# Patient Record
Sex: Female | Born: 1955 | Race: White | Hispanic: No | Marital: Married | Smoking: Former smoker
Health system: Southern US, Community
[De-identification: ages and names within clinical notes are randomized; demographics above are authoritative.]

## PROBLEM LIST (undated history)

## (undated) DIAGNOSIS — E119 Type 2 diabetes mellitus without complications: Secondary | ICD-10-CM

## (undated) DIAGNOSIS — I10 Essential (primary) hypertension: Secondary | ICD-10-CM

## (undated) DIAGNOSIS — E079 Disorder of thyroid, unspecified: Secondary | ICD-10-CM

## (undated) HISTORY — PX: WISDOM TOOTH EXTRACTION: SHX21

## (undated) HISTORY — PX: APPENDECTOMY: SHX54

## (undated) HISTORY — PX: ANKLE SURGERY: SHX546

---

## 2000-10-20 ENCOUNTER — Ambulatory Visit (HOSPITAL_BASED_OUTPATIENT_CLINIC_OR_DEPARTMENT_OTHER): Admission: RE | Admit: 2000-10-20 | Discharge: 2000-10-20 | Payer: Self-pay | Admitting: Internal Medicine

## 2000-11-04 ENCOUNTER — Ambulatory Visit (HOSPITAL_COMMUNITY): Admission: RE | Admit: 2000-11-04 | Discharge: 2000-11-04 | Payer: Self-pay | Admitting: Gastroenterology

## 2001-02-23 ENCOUNTER — Other Ambulatory Visit: Admission: RE | Admit: 2001-02-23 | Discharge: 2001-02-23 | Payer: Self-pay | Admitting: *Deleted

## 2002-06-23 ENCOUNTER — Encounter: Admission: RE | Admit: 2002-06-23 | Discharge: 2002-06-23 | Payer: Self-pay | Admitting: Internal Medicine

## 2002-06-23 ENCOUNTER — Encounter: Payer: Self-pay | Admitting: Internal Medicine

## 2002-10-03 ENCOUNTER — Encounter: Admission: RE | Admit: 2002-10-03 | Discharge: 2002-10-03 | Payer: Self-pay | Admitting: General Surgery

## 2002-10-03 ENCOUNTER — Encounter: Payer: Self-pay | Admitting: General Surgery

## 2003-01-26 ENCOUNTER — Encounter: Payer: Self-pay | Admitting: General Surgery

## 2003-01-26 ENCOUNTER — Encounter: Admission: RE | Admit: 2003-01-26 | Discharge: 2003-01-26 | Payer: Self-pay | Admitting: General Surgery

## 2003-01-27 ENCOUNTER — Encounter (INDEPENDENT_AMBULATORY_CARE_PROVIDER_SITE_OTHER): Payer: Self-pay | Admitting: Specialist

## 2003-01-27 ENCOUNTER — Ambulatory Visit (HOSPITAL_BASED_OUTPATIENT_CLINIC_OR_DEPARTMENT_OTHER): Admission: RE | Admit: 2003-01-27 | Discharge: 2003-01-27 | Payer: Self-pay | Admitting: General Surgery

## 2004-11-10 ENCOUNTER — Encounter: Admission: RE | Admit: 2004-11-10 | Discharge: 2004-11-10 | Payer: Self-pay | Admitting: Internal Medicine

## 2004-12-04 ENCOUNTER — Encounter: Admission: RE | Admit: 2004-12-04 | Discharge: 2004-12-04 | Payer: Self-pay | Admitting: Obstetrics and Gynecology

## 2007-09-22 ENCOUNTER — Encounter: Admission: RE | Admit: 2007-09-22 | Discharge: 2007-09-22 | Payer: Self-pay | Admitting: Obstetrics

## 2008-06-15 ENCOUNTER — Encounter: Admission: RE | Admit: 2008-06-15 | Discharge: 2008-06-15 | Payer: Self-pay | Admitting: Internal Medicine

## 2011-01-17 NOTE — Op Note (Signed)
   NAME:  Courtney Fernandez, Courtney Fernandez                        ACCOUNT NO.:  [id number]   MEDICAL RECORD NO.:  [id number]                   PATIENT TYPE:  AMB   LOCATION:  DSC                                  FACILITY:  [facility]   PHYSICIAN:  Jimmye Norman III, M.D.               DATE OF BIRTH:  09-Jul-1956   DATE OF PROCEDURE:  01/27/2003  DATE OF DISCHARGE:                                 OPERATIVE REPORT   PREOPERATIVE DIAGNOSIS:  Left cervical submandibular mass.   POSTOPERATIVE DIAGNOSIS:  Left submandibular lymph node with mild  lymphadenopathy.   PROCEDURE:  Left submandibular lymph node mass removal or excision.   SURGEON:  Jimmye Norman, M.D.   ASSISTANT:  None.   ANESTHESIA:  General endotracheal.   ESTIMATED BLOOD LOSS:  Less than 20 mL.   COMPLICATIONS:  None.   CONDITION:  Stable.   INDICATIONS FOR OPERATION:  The patient is a 55 year old woman with a  persistent mass at the left submandibular anterior cervical area who comes  in now for a biopsy.   OPERATION:  The patient was taken to the operating room and placed on the  table in the supine position.  After an adequate endotracheal anesthetic was  administered, she was prepped and draped in the usual sterile manner,  exposing the left neck, which was turned to the right.   Just over the palpable mass in the submandibular area, a transverse  curvilinear incision was made using a #15 blade, approximately 1-1/2 to 2 cm  long.  We subsequently dissected down to the subcutaneous tissue, and using  mainly palpation, we were able to dissect out the submandibular lymph node,  which measured about a cm in size.  There were minimal neurovascular  structures which were encountered during the procedure, and it appeared to  be just beneath the platysmas layer.   Once the lymph node was removed, hemostasis was obtained using  electrocautery.  Then, the wound was closed with two layers, the subcu layer  with interrupted 4-0 Vicryl and then  the skin closed using running  subcuticular stitch of 5-0 Vicryl.  A sterile dressing was applied,  including Steri-Strips.  All needle counts, sponge counts, and instrument  counts were correct.                                               Kathrin Ruddy, M.D.   JW/MEDQ  D:  01/27/2003  T:  01/27/2003  Job:  [id number]

## 2011-01-17 NOTE — Procedures (Signed)
[facility]  Patient:    Courtney Fernandez, Courtney Fernandez                       MRN: [id number] Proc. Date: 11/04/00 Attending:  Verlin Grills, M.D.                           Procedure Report  DATE OF BIRTH:  09/01/1956  REFERRING PHYSICIAN:  PROCEDURE PERFORMED:  Colonoscopy.  ENDOSCOPIST:  Verlin Grills, M.D.  INDICATIONS FOR PROCEDURE:  The patient is a 55 year old female.  She has symptoms compatible with irritable bowel syndrome (diarrhea alternating with constipation and right-sided abdominal pain).  She intermittently passes fresh blood with her constipated bowel movements.  Her brother has had colon  polyps removed.  She denies a family history of colon cancer.  I discussed with the patient the complications associated with colonoscopy and polypectomy including a 1 per 1000 risk of bleeding and 4 per 1000 risk of colonic rupture requiring emergency surgery.  The patient has signed the operative permit.  PREMEDICATION:  Fentanyl 50 mcg, Versed 10 mg.  ENDOSCOPE:  Olympus pediatric colonoscope.  DESCRIPTION OF PROCEDURE:  After obtaining informed consent, the patient was placed in the left lateral decubitus position.  I administered intravenous fentanyl and intravenous Versed to achieve sedation for the procedure.  The patients blood pressure, oxygen saturation and cardiac rhythm were monitored throughout the procedure and documented in the medical record.  Anal inspection was normal.  Digital rectal examination was normal.  The Olympus pediatric video colonoscope was then introduced into the rectum and under direct vision, easily advanced to the cecum.  Colonic preparation for the exam today was excellent.  Rectum:  Normal.  Sigmoid colon and descending colon:  Normal.  Splenic flexure:  Normal.  Transverse colon:  Normal.  Hepatic flexure:  Normal.  Ascending colon:  Normal.  Cecum and ileocecal valve:   Normal.  ASSESSMENT:  Normal proctocolonoscopy to the cecum.  No endoscopic evidence for the presence of colorectal neoplasia or inflammatory bowel disease.  RECOMMENDATIONS:  Repeat colonoscopy in approximately 10 years. DD:  11/04/00 TD:  11/04/00 Job: [id number]

## 2012-03-23 ENCOUNTER — Other Ambulatory Visit (HOSPITAL_COMMUNITY)
Admission: RE | Admit: 2012-03-23 | Discharge: 2012-03-23 | Disposition: A | Discharge status: Home / Self Care | Payer: Self-pay | Source: Ambulatory Visit | Attending: Obstetrics and Gynecology | Admitting: Obstetrics and Gynecology

## 2012-03-23 ENCOUNTER — Other Ambulatory Visit: Payer: Self-pay | Admitting: Nurse Practitioner

## 2012-03-23 DIAGNOSIS — Z1151 Encounter for screening for human papillomavirus (HPV): Secondary | ICD-10-CM | POA: Insufficient documentation

## 2012-03-23 DIAGNOSIS — Z01419 Encounter for gynecological examination (general) (routine) without abnormal findings: Secondary | ICD-10-CM | POA: Insufficient documentation

## 2014-09-13 ENCOUNTER — Other Ambulatory Visit: Payer: Self-pay | Admitting: Dermatology

## 2014-09-13 ENCOUNTER — Other Ambulatory Visit: Payer: Self-pay | Admitting: Family Medicine

## 2014-09-13 DIAGNOSIS — R109 Unspecified abdominal pain: Secondary | ICD-10-CM

## 2014-09-19 ENCOUNTER — Ambulatory Visit
Admission: RE | Admit: 2014-09-19 | Discharge: 2014-09-19 | Disposition: A | Discharge status: Home / Self Care | Payer: 59 | Source: Ambulatory Visit | Attending: Family Medicine | Admitting: Family Medicine

## 2014-09-19 DIAGNOSIS — R109 Unspecified abdominal pain: Secondary | ICD-10-CM

## 2014-09-26 ENCOUNTER — Other Ambulatory Visit: Payer: Self-pay | Admitting: Nurse Practitioner

## 2014-09-26 ENCOUNTER — Other Ambulatory Visit (HOSPITAL_COMMUNITY)
Admission: RE | Admit: 2014-09-26 | Discharge: 2014-09-26 | Disposition: A | Discharge status: Home / Self Care | Payer: 59 | Source: Ambulatory Visit | Attending: Nurse Practitioner | Admitting: Nurse Practitioner

## 2014-09-26 DIAGNOSIS — Z1151 Encounter for screening for human papillomavirus (HPV): Secondary | ICD-10-CM | POA: Insufficient documentation

## 2014-09-26 DIAGNOSIS — N949 Unspecified condition associated with female genital organs and menstrual cycle: Secondary | ICD-10-CM

## 2014-09-26 DIAGNOSIS — R102 Pelvic and perineal pain: Secondary | ICD-10-CM

## 2014-09-26 DIAGNOSIS — Z01419 Encounter for gynecological examination (general) (routine) without abnormal findings: Secondary | ICD-10-CM | POA: Insufficient documentation

## 2014-09-27 LAB — CYTOLOGY - PAP

## 2014-10-04 ENCOUNTER — Ambulatory Visit
Admission: RE | Admit: 2014-10-04 | Discharge: 2014-10-04 | Disposition: A | Discharge status: Home / Self Care | Payer: 59 | Source: Ambulatory Visit | Attending: Nurse Practitioner | Admitting: Nurse Practitioner

## 2014-10-04 DIAGNOSIS — R102 Pelvic and perineal pain: Secondary | ICD-10-CM

## 2014-10-04 DIAGNOSIS — N949 Unspecified condition associated with female genital organs and menstrual cycle: Secondary | ICD-10-CM

## 2014-10-04 MED ORDER — GADOBENATE DIMEGLUMINE 529 MG/ML IV SOLN
20.0000 mL | Freq: Once | INTRAVENOUS | Status: AC | PRN
  Administered 2014-10-04: 20 mL via INTRAVENOUS

## 2015-04-06 ENCOUNTER — Other Ambulatory Visit: Payer: Self-pay | Admitting: Obstetrics & Gynecology

## 2015-07-30 ENCOUNTER — Encounter (HOSPITAL_COMMUNITY): Payer: Self-pay | Admitting: Emergency Medicine

## 2015-07-30 DIAGNOSIS — E039 Hypothyroidism, unspecified: Secondary | ICD-10-CM | POA: Diagnosis present

## 2015-07-30 DIAGNOSIS — F329 Major depressive disorder, single episode, unspecified: Secondary | ICD-10-CM | POA: Diagnosis present

## 2015-07-30 DIAGNOSIS — E1100 Type 2 diabetes mellitus with hyperosmolarity without nonketotic hyperglycemic-hyperosmolar coma (NKHHC): Principal | ICD-10-CM | POA: Diagnosis present

## 2015-07-30 DIAGNOSIS — Z833 Family history of diabetes mellitus: Secondary | ICD-10-CM

## 2015-07-30 DIAGNOSIS — Z79899 Other long term (current) drug therapy: Secondary | ICD-10-CM

## 2015-07-30 DIAGNOSIS — I10 Essential (primary) hypertension: Secondary | ICD-10-CM | POA: Diagnosis present

## 2015-07-30 DIAGNOSIS — R5383 Other fatigue: Secondary | ICD-10-CM | POA: Diagnosis not present

## 2015-07-30 DIAGNOSIS — E1165 Type 2 diabetes mellitus with hyperglycemia: Secondary | ICD-10-CM | POA: Diagnosis present

## 2015-07-30 LAB — CBG MONITORING, ED: Glucose-Capillary: >600 mg/dL (ref 65–99)

## 2015-07-30 NOTE — ED Notes (Signed)
Pt. reports fatigue , blurred vision , nausea and polydipsia onset this week , denies fever / respirations unlabored.

## 2015-07-31 ENCOUNTER — Inpatient Hospital Stay (HOSPITAL_COMMUNITY)
Admission: EM | Admit: 2015-07-31 | Discharge: 2015-08-01 | DRG: 639 | Disposition: A | Discharge status: Home / Self Care | Payer: 59 | Attending: Internal Medicine | Admitting: Internal Medicine

## 2015-07-31 ENCOUNTER — Encounter (HOSPITAL_COMMUNITY): Payer: Self-pay | Admitting: General Practice

## 2015-07-31 DIAGNOSIS — R5383 Other fatigue: Secondary | ICD-10-CM | POA: Diagnosis present

## 2015-07-31 DIAGNOSIS — E1100 Type 2 diabetes mellitus with hyperosmolarity without nonketotic hyperglycemic-hyperosmolar coma (NKHHC): Secondary | ICD-10-CM

## 2015-07-31 DIAGNOSIS — Z79899 Other long term (current) drug therapy: Secondary | ICD-10-CM | POA: Diagnosis not present

## 2015-07-31 DIAGNOSIS — R739 Hyperglycemia, unspecified: Secondary | ICD-10-CM | POA: Diagnosis not present

## 2015-07-31 DIAGNOSIS — E1165 Type 2 diabetes mellitus with hyperglycemia: Secondary | ICD-10-CM | POA: Diagnosis present

## 2015-07-31 DIAGNOSIS — I10 Essential (primary) hypertension: Secondary | ICD-10-CM | POA: Diagnosis present

## 2015-07-31 DIAGNOSIS — E039 Hypothyroidism, unspecified: Secondary | ICD-10-CM | POA: Diagnosis present

## 2015-07-31 DIAGNOSIS — Z833 Family history of diabetes mellitus: Secondary | ICD-10-CM | POA: Diagnosis not present

## 2015-07-31 DIAGNOSIS — F329 Major depressive disorder, single episode, unspecified: Secondary | ICD-10-CM | POA: Diagnosis present

## 2015-07-31 HISTORY — DX: Type 2 diabetes mellitus without complications: E11.9

## 2015-07-31 HISTORY — DX: Disorder of thyroid, unspecified: E07.9

## 2015-07-31 HISTORY — DX: Essential (primary) hypertension: I10

## 2015-07-31 LAB — GLUCOSE, CAPILLARY
GLUCOSE-CAPILLARY: 126 mg/dL — AB (ref 65–99)
GLUCOSE-CAPILLARY: 160 mg/dL — AB (ref 65–99)
GLUCOSE-CAPILLARY: 156 mg/dL — AB (ref 65–99)
GLUCOSE-CAPILLARY: 286 mg/dL — AB (ref 65–99)
GLUCOSE-CAPILLARY: 301 mg/dL — AB (ref 65–99)
Glucose-Capillary: 152 mg/dL — ABNORMAL HIGH (ref 65–99)
Glucose-Capillary: 244 mg/dL — ABNORMAL HIGH (ref 65–99)

## 2015-07-31 LAB — BASIC METABOLIC PANEL
ANION GAP: 8 (ref 5–15)
ANION GAP: 8 (ref 5–15)
Anion gap: 6 (ref 5–15)
Anion gap: 4 — ABNORMAL LOW (ref 5–15)
Anion gap: 6 (ref 5–15)
BUN: 18 mg/dL (ref 6–20)
BUN: 15 mg/dL (ref 6–20)
BUN: 10 mg/dL (ref 6–20)
BUN: 9 mg/dL (ref 6–20)
BUN: 7 mg/dL (ref 6–20)
CALCIUM: 9.2 mg/dL (ref 8.9–10.3)
CALCIUM: 8.9 mg/dL (ref 8.9–10.3)
CALCIUM: 8.8 mg/dL — AB (ref 8.9–10.3)
CALCIUM: 8.7 mg/dL — AB (ref 8.9–10.3)
CALCIUM: 8.8 mg/dL — AB (ref 8.9–10.3)
CO2: 29 mmol/L (ref 22–32)
CO2: 26 mmol/L (ref 22–32)
CO2: 27 mmol/L (ref 22–32)
CO2: 27 mmol/L (ref 22–32)
CO2: 27 mmol/L (ref 22–32)
Chloride: 92 mmol/L — ABNORMAL LOW (ref 101–111)
Chloride: 100 mmol/L — ABNORMAL LOW (ref 101–111)
Chloride: 105 mmol/L (ref 101–111)
Chloride: 104 mmol/L (ref 101–111)
Chloride: 104 mmol/L (ref 101–111)
Creatinine, Ser: 0.94 mg/dL (ref 0.44–1.00)
Creatinine, Ser: 0.8 mg/dL (ref 0.44–1.00)
Creatinine, Ser: 0.59 mg/dL (ref 0.44–1.00)
Creatinine, Ser: 0.67 mg/dL (ref 0.44–1.00)
Creatinine, Ser: 0.67 mg/dL (ref 0.44–1.00)
GFR calc Af Amer: >60 mL/min (ref >60)
GFR calc Af Amer: >60 mL/min (ref >60)
GFR calc Af Amer: >60 mL/min (ref >60)
GFR calc Af Amer: >60 mL/min (ref >60)
GFR calc Af Amer: >60 mL/min (ref >60)
GLUCOSE: 622 mg/dL — AB (ref 65–99)
GLUCOSE: 390 mg/dL — AB (ref 65–99)
GLUCOSE: 172 mg/dL — AB (ref 65–99)
GLUCOSE: 302 mg/dL — AB (ref 65–99)
GLUCOSE: 325 mg/dL — AB (ref 65–99)
POTASSIUM: 3.8 mmol/L (ref 3.5–5.1)
Potassium: 4.6 mmol/L (ref 3.5–5.1)
Potassium: 3.6 mmol/L (ref 3.5–5.1)
Potassium: 4.1 mmol/L (ref 3.5–5.1)
Potassium: 4.3 mmol/L (ref 3.5–5.1)
Sodium: 129 mmol/L — ABNORMAL LOW (ref 135–145)
Sodium: 134 mmol/L — ABNORMAL LOW (ref 135–145)
Sodium: 138 mmol/L (ref 135–145)
Sodium: 135 mmol/L (ref 135–145)
Sodium: 137 mmol/L (ref 135–145)

## 2015-07-31 LAB — CBG MONITORING, ED
GLUCOSE-CAPILLARY: 220 mg/dL — AB (ref 65–99)
GLUCOSE-CAPILLARY: 255 mg/dL — AB (ref 65–99)
Glucose-Capillary: >600 mg/dL (ref 65–99)
Glucose-Capillary: 425 mg/dL — ABNORMAL HIGH (ref 65–99)
Glucose-Capillary: 364 mg/dL — ABNORMAL HIGH (ref 65–99)
Glucose-Capillary: 149 mg/dL — ABNORMAL HIGH (ref 65–99)

## 2015-07-31 LAB — COMPREHENSIVE METABOLIC PANEL
ALT: 24 U/L (ref 14–54)
AST: 17 U/L (ref 15–41)
Albumin: 4.1 g/dL (ref 3.5–5.0)
Alkaline Phosphatase: 109 U/L (ref 38–126)
Anion gap: 10 (ref 5–15)
BUN: 22 mg/dL — ABNORMAL HIGH (ref 6–20)
CHLORIDE: 80 mmol/L — AB (ref 101–111)
CO2: 28 mmol/L (ref 22–32)
CREATININE: 1.16 mg/dL — AB (ref 0.44–1.00)
Calcium: 10 mg/dL (ref 8.9–10.3)
GFR calc non Af Amer: 51 mL/min — ABNORMAL LOW (ref >60)
GFR, EST AFRICAN AMERICAN: 59 mL/min — AB (ref >60)
Glucose, Bld: 1200 mg/dL (ref 65–99)
Potassium: 4.8 mmol/L (ref 3.5–5.1)
SODIUM: 118 mmol/L — AB (ref 135–145)
Total Bilirubin: 1 mg/dL (ref 0.3–1.2)
Total Protein: 7.6 g/dL (ref 6.5–8.1)

## 2015-07-31 LAB — URINALYSIS, ROUTINE W REFLEX MICROSCOPIC
Bilirubin Urine: NEGATIVE
Glucose, UA: >1000 mg/dL — AB
Hgb urine dipstick: NEGATIVE
Ketones, ur: NEGATIVE mg/dL
LEUKOCYTES UA: NEGATIVE
NITRITE: NEGATIVE
PH: 6.5 (ref 5.0–8.0)
Protein, ur: NEGATIVE mg/dL
SPECIFIC GRAVITY, URINE: 1.031 — AB (ref 1.005–1.030)

## 2015-07-31 LAB — CBC
HCT: 34 % — ABNORMAL LOW (ref 36.0–46.0)
Hemoglobin: 12.1 g/dL (ref 12.0–15.0)
MCH: 29.8 pg (ref 26.0–34.0)
MCHC: 35.6 g/dL (ref 30.0–36.0)
MCV: 83.7 fL (ref 78.0–100.0)
PLATELETS: 166 10*3/uL (ref 150–400)
RBC: 4.06 MIL/uL (ref 3.87–5.11)
RDW: 11.9 % (ref 11.5–15.5)
WBC: 6.6 10*3/uL (ref 4.0–10.5)

## 2015-07-31 LAB — MRSA PCR SCREENING: MRSA by PCR: NEGATIVE

## 2015-07-31 LAB — URINE MICROSCOPIC-ADD ON: BACTERIA UA: NONE SEEN

## 2015-07-31 LAB — TSH: TSH: 5.75 u[IU]/mL — ABNORMAL HIGH (ref 0.350–4.500)

## 2015-07-31 MED ORDER — SODIUM CHLORIDE 0.9 % IV SOLN
INTRAVENOUS | Status: DC
Start: 2015-07-31 — End: 2015-07-31
  Administered 2015-07-31: 03:00:00 via INTRAVENOUS

## 2015-07-31 MED ORDER — INSULIN ASPART 100 UNIT/ML ~~LOC~~ SOLN
0.0000 [IU] | SUBCUTANEOUS | Status: DC
  Administered 2015-07-31: 8 [IU] via SUBCUTANEOUS
  Administered 2015-07-31: 11 [IU] via SUBCUTANEOUS
  Administered 2015-07-31: 5 [IU] via SUBCUTANEOUS
  Administered 2015-08-01: 2 [IU] via SUBCUTANEOUS

## 2015-07-31 MED ORDER — ACETAMINOPHEN 325 MG PO TABS
650.0000 mg | ORAL_TABLET | Freq: Four times a day (QID) | ORAL | Status: DC | PRN
  Administered 2015-07-31 (×2): 650 mg via ORAL
  Filled 2015-07-31 (×2): qty 2

## 2015-07-31 MED ORDER — INSULIN ASPART 100 UNIT/ML ~~LOC~~ SOLN
5.0000 [IU] | Freq: Once | SUBCUTANEOUS | Status: AC
  Administered 2015-07-31: 5 [IU] via INTRAVENOUS
  Filled 2015-07-31: qty 1

## 2015-07-31 MED ORDER — LEVOTHYROXINE SODIUM 100 MCG PO TABS
100.0000 ug | ORAL_TABLET | Freq: Every day | ORAL | Status: DC
  Administered 2015-07-31 – 2015-08-01 (×2): 100 ug via ORAL
  Filled 2015-07-31 (×2): qty 1

## 2015-07-31 MED ORDER — HEPARIN SODIUM (PORCINE) 5000 UNIT/ML IJ SOLN
5000.0000 [IU] | Freq: Three times a day (TID) | INTRAMUSCULAR | Status: DC
  Administered 2015-07-31 – 2015-08-01 (×4): 5000 [IU] via SUBCUTANEOUS
  Filled 2015-07-31 (×4): qty 1

## 2015-07-31 MED ORDER — ESCITALOPRAM OXALATE 10 MG PO TABS
10.0000 mg | ORAL_TABLET | Freq: Every day | ORAL | Status: DC
  Administered 2015-07-31 – 2015-08-01 (×2): 10 mg via ORAL
  Filled 2015-07-31 (×2): qty 1

## 2015-07-31 MED ORDER — POTASSIUM CHLORIDE 10 MEQ/100ML IV SOLN
10.0000 meq | INTRAVENOUS | Status: AC
  Administered 2015-07-31 (×2): 10 meq via INTRAVENOUS
  Filled 2015-07-31 (×2): qty 100

## 2015-07-31 MED ORDER — SODIUM CHLORIDE 0.9 % IV SOLN
1000.0000 mL | INTRAVENOUS | Status: DC
  Administered 2015-07-31: 1000 mL via INTRAVENOUS

## 2015-07-31 MED ORDER — SODIUM CHLORIDE 0.9 % IV SOLN
1000.0000 mL | Freq: Once | INTRAVENOUS | Status: AC
  Administered 2015-07-31: 1000 mL via INTRAVENOUS

## 2015-07-31 MED ORDER — SODIUM CHLORIDE 0.9 % IV SOLN
INTRAVENOUS | Status: DC
  Administered 2015-07-31: 03:00:00 via INTRAVENOUS

## 2015-07-31 MED ORDER — LIVING WELL WITH DIABETES BOOK
Freq: Once | Status: AC
  Administered 2015-07-31: 13:00:00
  Filled 2015-07-31: qty 1

## 2015-07-31 MED ORDER — INSULIN GLARGINE 100 UNIT/ML ~~LOC~~ SOLN
10.0000 [IU] | Freq: Once | SUBCUTANEOUS | Status: AC
  Administered 2015-07-31: 10 [IU] via SUBCUTANEOUS
  Filled 2015-07-31: qty 0.1

## 2015-07-31 MED ORDER — SODIUM CHLORIDE 0.9 % IV SOLN
INTRAVENOUS | Status: DC
  Administered 2015-07-31: 5.4 [IU]/h via INTRAVENOUS
  Filled 2015-07-31: qty 2.5

## 2015-07-31 MED ORDER — DEXTROSE-NACL 5-0.45 % IV SOLN
INTRAVENOUS | Status: DC
  Administered 2015-07-31: 07:00:00 via INTRAVENOUS

## 2015-07-31 NOTE — Progress Notes (Signed)
Courtney Fernandez [id number] DOB: [birth year] DOA: 07/31/2015 PCP: Clayborn HeronVictoria R Rankins, MD  Admit HPI / Brief Narrative: 59 y.o. female PMHx Hypothyroidism, HTN,   with no h/o DM previously. She has been having polydipsia, polyuria, fatigue for the past 1 month. Also saw opthomologist for blurred vision.  Patient was worried she might be developing DM, took BGL at home on her aunts glucometer and it was 500 so she came in to ED to get checked out and see if she was indeed diabetic.  BGL in ED is 1200.   HPI/Subjective: 11/29 A/O 4, NAD. States at her physical approximately one year ago normal blood glucose levels. Has one family member who was diabetic later on in life.  Assessment/Plan: HHNS  -A1C pending -Glucose stabilizer protocol; Insulin gtt for very high BGL of 1200 on admission --After patient CBG<200 for 4 readings administer 10 units Lantus. 2 hours later discontinue glucose stabilizer. -Consult to diabetic coordinator placed -Consult to nutrition is placed; patient new onset diabetes -Patient will need to be discharged on insulin, with follow-up with her PCP -Counseled on maintaining log book of FSBS every before meals/daily at bedtime, as well as all food and drink consumed during her day.  Hypothyroidism -Synthroid 100 g daily -TSH pending  Depression -Lexapro 10 mg daily    Code Status: FULL Family Communication: no family present at time of exam Disposition Plan: Discharge in a.m.    Consultants: NA  Procedure/Significant Events: NA   Culture NA  Antibiotics: NA  DVT prophylaxis: Subcutaneous heparin  Devices NA  LINES / TUBES:  NA    Continuous Infusions: . sodium chloride Stopped (07/31/15 0334)  . sodium chloride Stopped (07/31/15 81190652)  . dextrose 5 % and 0.45% NaCl 50 mL/hr at 07/31/15 0713  . insulin (NOVOLIN-R) infusion 3.9 Units/hr (07/31/15 [id number])     Objective: VITAL SIGNS: Temp: 97.5 F (36.4 C) (11/29 0900) Temp Source: Oral (11/28 2305) BP: 122/62 mmHg (11/29 0707) Pulse Rate: 72 (11/29 0707) SPO2; FIO2:  No intake or output data in the 24 hours ending 07/31/15 0909   Exam: General: A/O 4, NAD, No acute respiratory distress Eyes: Negative headache, eye pain, double vision,negative scleral hemorrhage ENT: Negative Runny nose, negative ear pain, negative gingival bleeding, Neck:  Negative scars, masses, torticollis, lymphadenopathy, JVD Lungs: Clear to auscultation bilaterally without wheezes or crackles Cardiovascular: Regular rate and rhythm without murmur gallop or rub normal S1 and S2 Abdomen:negative abdominal pain, nondistended, positive soft, bowel sounds, no rebound, no ascites, no appreciable mass Extremities: No significant cyanosis, clubbing, or edema bilateral lower extremities Psychiatric:  Negative depression, negative anxiety, negative fatigue, negative mania  Neurologic:  Cranial nerves II through XII intact, tongue/uvula midline, all extremities muscle strength 5/5, sensation intact throughout, negative dysarthria, negative expressive aphasia, negative receptive aphasia.   Data Reviewed: Basic Metabolic Panel:  Recent Labs Lab 07/30/15 2320 07/31/15 0307 07/31/15 0518  NA 118* 129* 134*  K 4.8 4.6 3.6  CL 80* 92* 100*  CO2 28 29 26   GLUCOSE 1200* 622* 390*  BUN 22* 18 15  CREATININE 1.16* 0.94 0.80  CALCIUM 10.0 9.2 8.9   Liver Function Tests:  Recent Labs Lab 07/30/15 2320  AST 17  ALT 24  ALKPHOS 109  BILITOT 1.0  PROT 7.6  ALBUMIN 4.1   No results for input(s): LIPASE, AMYLASE in the last 168 hours. No results for input(s): AMMONIA in the last 168 hours. CBC:  Recent Labs Lab 07/31/15 0307  WBC 6.6  HGB 12.1  HCT 34.0*  MCV 83.7  PLT 166   Cardiac Enzymes: No results for input(s): CKTOTAL, CKMB, CKMBINDEX, TROPONINI in the last 168 hours. BNP (last 3 results) No  results for input(s): BNP in the last 8760 hours.  ProBNP (last 3 results) No results for input(s): PROBNP in the last 8760 hours.  CBG:  Recent Labs Lab 07/31/15 0411 07/31/15 0520 07/31/15 0649 07/31/15 0709 07/31/15 0822  GLUCAP 425* 364* 255* 220* 149*    No results found for this or any previous visit (from the past 240 hour(s)).   Studies:  Recent x-ray studies have been reviewed in detail by the Attending Physician  Scheduled Meds:  Scheduled Meds: . heparin  5,000 Units Subcutaneous 3 times per day    Time spent on care of this patient: 40 mins   Jamilla Galli, Roselind Messier , MD  [facility] Office  [phone] Pager [phone]  On-Call/Text Page:      [email or web address]      password [email or web address]  If 7PM-7AM, please contact night-coverage [email or web address] Password [email or web address] 07/31/2015, 9:09 AM   LOS: 0 days   Care during the described time interval was provided by me .  I have reviewed this patient's available data, including medical history, events of note, physical examination, and all test results as part of my evaluation. I have personally reviewed and interpreted all radiology studies.   Carolyne Littles, MD [phone] Pager

## 2015-07-31 NOTE — ED Notes (Signed)
Dr. Julian ReilGardner asked that the pt receive 2 saline bolus's, have her CBG rechecked, and then start the pt on the insulin drip.

## 2015-07-31 NOTE — Progress Notes (Signed)
Inpatient Diabetes Program Recommendations  AACE/ADA: New Consensus Statement on Inpatient Glycemic Control (2015)  Target Ranges:  Prepandial:   less than 140 mg/dL      Peak postprandial:   less than 180 mg/dL (1-2 hours)      Critically ill patients:  140 - 180 mg/dL   Patient had her aunt's meter at her home and checked her blood sugars because she was symptomatic- excess thirst and urination.  She had purchased 50 testing strips yesterday so they are within date.  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 I have given her the Living Well with Diabetes book and asked her to review.  I went over the A1C and the symptoms and treatment of hypoglycemia with the patient.  Answered the patient questions regarding blurred vision.   Dietitian referral noted in the orders. Please contact the Diabetes Coordinators for further patient questions.   Susette RacerJulie Nikkol Pai, RN, BA, MHA, CDE Diabetes Coordinator Inpatient Diabetes Program  [phone] (Team Pager) [phone] [facility] Office) 07/31/2015 1:00 PM

## 2015-07-31 NOTE — ED Notes (Signed)
Dr. Julian ReilGardner to put in orders for pts breathing machine.

## 2015-07-31 NOTE — H&P (Signed)
[facility] History and Physical  Courtney Fernandez [id number] DOB: 07/01/1956 DOA: 07/31/2015  Referring physician: EDP PCP: Clayborn HeronVictoria R Rankins, MD   Chief Complaint: Polydipsia, fatigue, hyperglycemia   HPI: Courtney Fernandez is a 59 y.o. female with no h/o DM previously.  She has been having polydipsia, polyuria, fatigue for the past 1 month.  Also saw opthomologist for blurred vision.  Patient was worried she might be developing DM, took BGL at home on her aunts glucometer and it was 500 so she came in to ED to get checked out and see if she was indeed diabetic.  BGL in ED is 1200.  Review of Systems: Systems reviewed.  As above, otherwise negative  Past Medical History  Diagnosis Date  . Thyroid disease   . Hypertension    Past Surgical History  Procedure Laterality Date  . Appendectomy     Social History:  reports that she has never smoked. She does not have any smokeless tobacco history on file. She reports that she does not drink alcohol or use illicit drugs.  Allergies  Allergen Reactions  . Sulfa Antibiotics     No family history on file.   Prior to Admission medications   Not on File   Physical Exam: Filed Vitals:   07/31/15 0100 07/31/15 0115  BP: 116/44 124/67  Pulse: 70   Temp:    Resp: 14 19    BP 124/67 mmHg  Pulse 70  Temp(Src) 98.1 F (36.7 C) (Oral)  Resp 19  SpO2 93%  General Appearance:    Alert, oriented, no distress, appears stated age  Head:    Normocephalic, atraumatic  Eyes:    PERRL, EOMI, sclera non-icteric        Nose:   Nares without drainage or epistaxis. Mucosa, turbinates normal  Throat:   Moist mucous membranes. Oropharynx without erythema or exudate.  Neck:   Supple. No carotid bruits.  No thyromegaly.  No lymphadenopathy.   Back:     No CVA tenderness, no spinal tenderness  Lungs:     Clear to auscultation bilaterally, without wheezes, rhonchi or rales  Chest wall:    No tenderness to palpitation  Heart:     Regular rate and rhythm without murmurs, gallops, rubs  Abdomen:     Soft, non-tender, nondistended, normal bowel sounds, no organomegaly  Genitalia:    deferred  Rectal:    deferred  Extremities:   No clubbing, cyanosis or edema.  Pulses:   2+ and symmetric all extremities  Skin:   Skin color, texture, turgor normal, no rashes or lesions  Lymph nodes:   Cervical, supraclavicular, and axillary nodes normal  Neurologic:   CNII-XII intact. Normal strength, sensation and reflexes      throughout    Labs on Admission:  Basic Metabolic Panel:  Recent Labs Lab 07/30/15 2320  NA 118*  K 4.8  CL 80*  CO2 28  GLUCOSE 1200*  BUN 22*  CREATININE 1.16*  CALCIUM 10.0   Liver Function Tests:  Recent Labs Lab 07/30/15 2320  AST 17  ALT 24  ALKPHOS 109  BILITOT 1.0  PROT 7.6  ALBUMIN 4.1   No results for input(s): LIPASE, AMYLASE in the last 168 hours. No results for input(s): AMMONIA in the last 168 hours. CBC: No results for input(s): WBC, NEUTROABS, HGB, HCT, MCV, PLT in the last 168 hours. Cardiac Enzymes: No results for input(s): CKTOTAL, CKMB, CKMBINDEX, TROPONINI in the last 168 hours.  BNP (last 3  results) No results for input(s): PROBNP in the last 8760 hours. CBG:  Recent Labs Lab 07/30/15 2309  GLUCAP >600*    Radiological Exams on Admission: No results found.  EKG: Independently reviewed.  Assessment/Plan Active Problems:   Hyperglycemia   Uncontrolled type 2 diabetes mellitus with hyperosmolar nonketotic hyperglycemia (HCC)   1. DM2 with hyperosmolar nonketotic hyperglycemia - 1. A1C pending 2. Insulin gtt for very high BGL of 1200 on admission 3. Using the DKA pathway for insulin gtt and lab orders (although technically patient does not have DKA). 4. Diabetes coordinator consult (though I suspect she will likely end up on insulin as oral hypoglycemics may not be potent enough to treat her given severity of presenting DM2).    Code Status: Full  Code  Family Communication: No family in room Disposition Plan: Admit to inpatient   Time spent: 70 min  Isaac Lacson M. [facility] Pager [phone]  If 7AM-7PM, please contact the day team taking care of the patient [email or web address] Password [email or web address] 07/31/2015, 1:22 AM

## 2015-07-31 NOTE — ED Provider Notes (Addendum)
CSN: [id number]     Arrival date & time 07/30/15  2257 History  By signing my name below, I, Emmanuella Mensah, attest that this documentation has been prepared under the direction and in the presence of Dione Booze, MD. Electronically Signed: Angelene Giovanni, ED Scribe. 07/31/2015. 12:47 AM.    Chief Complaint  Patient presents with  . Polydipsia  . Blurred Vision  . Fatigue   The history is provided by the patient. No language interpreter was used.   HPI Comments: Courtney Fernandez is a 59 y.o. female with a hx of hypothyroid disease and HTN who presents to the Emergency Department complaining of hyperglycemia onset today. She reports associated nausea. She denies an official dx of DM but states that she has been experiencing polydipsia, frequent urination, blurry vision, HA, and fatigue onset one month ago. She states that she checked her blood sugar today and was in the 500s and is here to check if she has DM. No alleviating factors noted.   Dr. Benetta Spar Rankins.   Past Medical History  Diagnosis Date  . Thyroid disease   . Hypertension    Past Surgical History  Procedure Laterality Date  . Appendectomy     No family history on file. Social History  Substance Use Topics  . Smoking status: Never Smoker   . Smokeless tobacco: None  . Alcohol Use: No   OB History    No data available     Review of Systems  Constitutional: Positive for fatigue. Negative for fever and chills.  Eyes: Positive for visual disturbance.  Gastrointestinal: Positive for nausea. Negative for vomiting.  Endocrine: Positive for polydipsia and polyuria.  Genitourinary: Positive for frequency.  Neurological: Positive for headaches.  All other systems reviewed and are negative.     Allergies  Sulfa antibiotics  Home Medications   Prior to Admission medications   Not on File   BP 162/68 mmHg  Pulse 79  Temp(Src) 98.1 F (36.7 C) (Oral)  Resp 18  SpO2 93% Physical Exam   Constitutional: She is oriented to person, place, and time. She appears well-developed and well-nourished. No distress.  HENT:  Head: Normocephalic and atraumatic.  Eyes: EOM are normal. Pupils are equal, round, and reactive to light.  Neck: Normal range of motion. Neck supple. No JVD present.  Cardiovascular: Normal rate and normal heart sounds.   No murmur heard. Pulmonary/Chest: Effort normal and breath sounds normal. She has no wheezes. She has no rales. She exhibits no tenderness.  Abdominal: Soft. Bowel sounds are normal. She exhibits no distension and no mass. There is no tenderness.  Musculoskeletal: Normal range of motion. She exhibits no edema.  Lymphadenopathy:    She has no cervical adenopathy.  Neurological: She is alert and oriented to person, place, and time. No cranial nerve deficit. She exhibits normal muscle tone. Coordination normal.  Skin: Skin is warm and dry. No rash noted.  Psychiatric: She has a normal mood and affect. Her behavior is normal. Judgment and thought content normal.  Nursing note and vitals reviewed.   ED Course  Procedures (including critical care time) DIAGNOSTIC STUDIES: Oxygen Saturation is 96% on RA, adequate by my interpretation.    COORDINATION OF CARE: 12:41 AM- Pt advised of plan for treatment and pt agrees. Will receive an IV and a dose of insulin to monitor blood sugar.    Labs Review Results for orders placed or performed during the hospital encounter of 07/31/15  Comprehensive metabolic panel  Result Value  Ref Range   Sodium 118 (LL) 135 - 145 mmol/L   Potassium 4.8 3.5 - 5.1 mmol/L   Chloride 80 (L) 101 - 111 mmol/L   CO2 28 22 - 32 mmol/L   Glucose, Bld 1200 (HH) 65 - 99 mg/dL   BUN 22 (H) 6 - 20 mg/dL   Creatinine, Ser 1.611.16 (H) 0.44 - 1.00 mg/dL   Calcium 09.610.0 8.9 - 04.510.3 mg/dL   Total Protein 7.6 6.5 - 8.1 g/dL   Albumin 4.1 3.5 - 5.0 g/dL   AST 17 15 - 41 U/L   ALT 24 14 - 54 U/L   Alkaline Phosphatase 109 38 - 126 U/L    Total Bilirubin 1.0 0.3 - 1.2 mg/dL   GFR calc non Af Amer 51 (L) >60 mL/min   GFR calc Af Amer 59 (L) >60 mL/min   Anion gap 10 5 - 15  Urinalysis, Routine w reflex microscopic ([facility])  Result Value Ref Range   Color, Urine YELLOW YELLOW   APPearance CLEAR CLEAR   Specific Gravity, Urine 1.031 (H) 1.005 - 1.030   pH 6.5 5.0 - 8.0   Glucose, UA >1000 (A) NEGATIVE mg/dL   Hgb urine dipstick NEGATIVE NEGATIVE   Bilirubin Urine NEGATIVE NEGATIVE   Ketones, ur NEGATIVE NEGATIVE mg/dL   Protein, ur NEGATIVE NEGATIVE mg/dL   Nitrite NEGATIVE NEGATIVE   Leukocytes, UA NEGATIVE NEGATIVE  Urine microscopic-add on  Result Value Ref Range   Squamous Epithelial / LPF 0-5 (A) NONE SEEN   WBC, UA 0-5 0 - 5 WBC/hpf   RBC / HPF 0-5 0 - 5 RBC/hpf   Bacteria, UA NONE SEEN NONE SEEN  CBG monitoring, ED  Result Value Ref Range   Glucose-Capillary >600 (HH) 65 - 99 mg/dL     Dione Boozeavid Alston Berrie, MD has personally reviewed and evaluated these lab results as part of his medical decision-making.  CRITICAL CARE Performed by: Dione BoozeGLICK,Letecia Arps Total critical care time: 40 minutes Critical care time was exclusive of separately billable procedures and treating other patients. Critical care was necessary to treat or prevent imminent or life-threatening deterioration. Critical care was time spent personally by me on the following activities: development of treatment plan with patient and/or surrogate as well as nursing, discussions with consultants, evaluation of patient's response to treatment, examination of patient, obtaining history from patient or surrogate, ordering and performing treatments and interventions, ordering and review of laboratory studies, ordering and review of radiographic studies, pulse oximetry and re-evaluation of patient's condition.  MDM   Final diagnoses:  Type 2 diabetes mellitus with hyperosmolar nonketotic hyperglycemia (HCC)    New-onset diabetes with severe hyperglycemia. She  is also noted to be borderline hyperosmolar with osmolality of 302. She started on IV hydration and intravenous insulin protocol. There is no evidence of ketosis. CO2 is normal and anion gap is normal. Review of past artery shows no relevant past visits. Case is discussed with Dr. Julian ReilGardner of [facility] arrangements are made to admit the patient.  I personally performed the services described in this documentation, which was scribed in my presence. The recorded information has been reviewed and is accurate.      Dione Boozeavid Teegan Guinther, MD 07/31/15 [id number]  Dione Boozeavid Kamylle Axelson, MD 07/31/15 [phone]

## 2015-07-31 NOTE — Progress Notes (Signed)
Utilization review completed.  

## 2015-07-31 NOTE — ED Notes (Signed)
Pt ambulated to restroom with this RN.   

## 2015-07-31 NOTE — ED Notes (Signed)
Glucometer reading high.

## 2015-07-31 NOTE — Progress Notes (Signed)
Nutrition Consult  RD consulted for nutrition education regarding diabetes.   No results found for: HGBA1C  RD provided "Carbohydrate Counting for People with Diabetes" handout from the Academy of Nutrition and Dietetics. Discussed different food groups and their effects on blood sugar, emphasizing carbohydrate-containing foods. Provided list of carbohydrates and recommended serving sizes of common foods.  Discussed importance of controlled and consistent carbohydrate intake throughout the day. Provided examples of ways to balance meals/snacks and encouraged intake of high-fiber, whole grain complex carbohydrates. Teach back method used.  Expect fair compliance. Patient sleepy during RD visit. She states she has no questions. Would benefit from outpatient diet education.  Body mass index is 35.03 kg/(m^2). Pt meets criteria for obesity, class 2 based on current BMI.  Current diet order is heart healthy CHO modified, patient is consuming approximately 100% of meals at this time. Labs and medications reviewed. No further nutrition interventions warranted at this time. RD contact information provided. If additional nutrition issues arise, please re-consult RD.  Joaquin CourtsKimberly Harris, RD, LDN, CNSC Pager [phone] After Hours Pager [phone]

## 2015-07-31 NOTE — ED Notes (Signed)
Pts CBG 255

## 2015-07-31 NOTE — ED Notes (Signed)
Pts CBG 364

## 2015-07-31 NOTE — Progress Notes (Signed)
Pt placed on CPAP for the night @  A pressure of 9 and a nasal mask. tol well at this time

## 2015-07-31 NOTE — ED Notes (Signed)
CBG 425. 

## 2015-08-01 DIAGNOSIS — E1100 Type 2 diabetes mellitus with hyperosmolarity without nonketotic hyperglycemic-hyperosmolar coma (NKHHC): Principal | ICD-10-CM

## 2015-08-01 LAB — GLUCOSE, CAPILLARY
GLUCOSE-CAPILLARY: 131 mg/dL — AB (ref 65–99)
GLUCOSE-CAPILLARY: 147 mg/dL — AB (ref 65–99)
GLUCOSE-CAPILLARY: 293 mg/dL — AB (ref 65–99)
Glucose-Capillary: 288 mg/dL — ABNORMAL HIGH (ref 65–99)

## 2015-08-01 LAB — HEMOGLOBIN A1C
HEMOGLOBIN A1C: 10.8 % — AB (ref 4.8–5.6)
MEAN PLASMA GLUCOSE: 263 mg/dL

## 2015-08-01 MED ORDER — FREESTYLE SYSTEM KIT: 1.0000 | PACK | Freq: Three times a day (TID) | Status: DC

## 2015-08-01 MED ORDER — INSULIN GLARGINE 100 UNIT/ML ~~LOC~~ SOLN
15.0000 [IU] | Freq: Every day | SUBCUTANEOUS | Status: DC
  Administered 2015-08-01: 15 [IU] via SUBCUTANEOUS
  Filled 2015-08-01: qty 0.15

## 2015-08-01 MED ORDER — METFORMIN HCL 500 MG PO TABS: 500.0000 mg | ORAL_TABLET | Freq: Two times a day (BID) | ORAL | Status: DC

## 2015-08-01 MED ORDER — INSULIN ASPART 100 UNIT/ML ~~LOC~~ SOLN: 0.0000 [IU] | Freq: Every day | SUBCUTANEOUS | Status: DC

## 2015-08-01 MED ORDER — INSULIN ASPART 100 UNIT/ML ~~LOC~~ SOLN
0.0000 [IU] | Freq: Three times a day (TID) | SUBCUTANEOUS | Status: DC
  Administered 2015-08-01 (×2): 8 [IU] via SUBCUTANEOUS

## 2015-08-01 MED ORDER — INSULIN PEN NEEDLE 32G X 8 MM MISC: Status: DC

## 2015-08-01 MED ORDER — INSULIN GLARGINE 100 UNIT/ML SOLOSTAR PEN: 15.0000 [IU] | PEN_INJECTOR | SUBCUTANEOUS | Status: DC

## 2015-08-01 NOTE — Progress Notes (Signed)
Pt completed watching DM videos 501 - 510.Able to give her own lantus insulin  Perfectly well. .Marland Kitchen

## 2015-08-01 NOTE — Plan of Care (Signed)
Problem: Education: Goal: Ability to describe self-care measures that may prevent or decrease complications (Diabetes Survival Skills Education) will improve Outcome: Progressing Educated patient re:  Home blood sugar testing  Hemoglobin A1C testing  Signs/symptoms of high/low blood glucose  Treatments for high/low blood glucose  Insulin administration (limited to hospital staff administration at this time)  Dietary management  Complications of severe hypo/hyperglycemia  Importance of PCP follow-up for diabetes management  Attempted to educate patient re:  self-administration of SQ insulin with return demonstration. Patient is not currently ready to learn self-administration of SQ insulin.      Problem: Health Behavior: Goal: Ability to identify and alter actions that are detrimental to health will improve Outcome: Progressing Patient is a non-smoker, drinks alcohol only occasionally and does not use illicit drugs. She is aware of her personal risk factors and seems willing to work on modifiable risk. Goal: Ability to identify and utilize available resources and services will improve Outcome: Progressing Active diabetes coordinator consult. Ongoing inpatient treatment/management for diabetes. Outpatient referral to diabetes education pending. Goal: Ability to manage health-related needs will improve Outcome: Progressing Progressing.  See previous note.

## 2015-08-01 NOTE — Discharge Summary (Signed)
Physician Discharge Summary  Courtney Fernandez [id number] DOB: 01/12/1956 DOA: 07/31/2015  PCP: Aretta Nip, MD  Admit date: 07/31/2015 Discharge date: 08/01/2015  Time spent: 30 minutes  Recommendations for Outpatient Follow-up:  1. Follow-up appointment with Dr. Radene Ou on 08/08/15 at 2 pm 2. Please review blood sugar logs and adjust insulin regimen as needed   Discharge Diagnoses:  Active Problems:   Hyperglycemia   Uncontrolled type 2 diabetes mellitus with hyperosmolar nonketotic hyperglycemia ([place])   Discharge Condition: Stable  Diet recommendation: Diabetic, carb modified  Filed Weights   07/31/15 0238 07/31/15 0856 08/01/15 0453  Weight: 94.802 kg (209 lb) 95.482 kg (210 lb 8 oz) 94.62 kg (208 lb 9.6 oz)    History of present illness:  Courtney Fernandez is a 59 y/o female with PMH of HTN and hypothyroidism who presented to the ED with polydipsia, polyuria, fatigue and blurred vision x 1 month. She checked her blood sugar at home using a relative's glucometer and it was 500 so she presented to the ED. She was found to be severely hyperglycemic in the ED with BGL of 1200, no evidence of ketosis. She was admitted for further work-up and insulin drip.    Hospital Course:   Type II DM, New  -Patient presented to the ED with symptoms of hyperglycemia and BGL of 1200, A1C 10.8 -Placed on glucose stabilizer protocol, CBGs down to 130-200s  -Diabetic coordinator, dietician and nursing staff provided education on diet, symptoms and treatment of hypoglycemia and self administration of insulin -Discharged on Metformin 500 mg BID and Lantus 15 units qHS  -Patient is discharged in stable condition and will follow-up with PCP on 12/7 to review blood sugar logs and adjust insulin regimen as needed   HTN  -BP stable throughout admission  -Continue HCTZ   Hypothyroid  -Continue Levothyroxine   Depression -Continue Lexapro    Procedures:  None  Consultations:  None  Discharge Exam: Filed Vitals:   07/31/15 2100 08/01/15 0453  BP: 134/75 129/65  Pulse: 79 77  Temp: 99.4 F (37.4 C) 98.6 F (37 C)  Resp: 20 19    General: Pleasant female, sitting upright in bed, NAD  Cardiovascular: RRR, no murmurs  Respiratory: Clear and equal bilaterally, no wheezes   Discharge Instructions   Discharge Instructions    Ambulatory referral to Nutrition and Diabetic Education    Complete by:  As directed      Call MD for:  difficulty breathing, headache or visual disturbances    Complete by:  As directed      Call MD for:  extreme fatigue    Complete by:  As directed      Call MD for:  hives    Complete by:  As directed      Call MD for:  persistant dizziness or light-headedness    Complete by:  As directed      Call MD for:  persistant nausea and vomiting    Complete by:  As directed      Call MD for:  redness, tenderness, or signs of infection (pain, swelling, redness, odor or green/yellow discharge around incision site)    Complete by:  As directed      Call MD for:  severe uncontrolled pain    Complete by:  As directed      Call MD for:  temperature >100.4    Complete by:  As directed      Diet Carb Modified    Complete by:  As directed      Discharge instructions    Complete by:  As directed   Please check your blood sugars before every meal and at bed time. Record these in a log and bring them with you to appointment with your primary care doctor.     Increase activity slowly    Complete by:  As directed           Current Discharge Medication List    START taking these medications   Details  glucose monitoring kit (FREESTYLE) monitoring kit 1 each by Does not apply route 4 (four) times daily - after meals and at bedtime. 1 month Diabetic Testing Supplies for QAC-QHS accuchecks. Qty: 1 each, Refills: 1    Insulin Glargine (LANTUS) 100 UNIT/ML Solostar Pen Inject 15 Units into the skin  every morning. Qty: 15 mL, Refills: 0    Insulin Pen Needle 32G X 8 MM MISC Use as directed Qty: 100 each, Refills: 0    metFORMIN (GLUCOPHAGE) 500 MG tablet Take 1 tablet (500 mg total) by mouth 2 (two) times daily with a meal. Take one time per day for first two days and then 2 times daily after. Qty: 60 tablet, Refills: 0      CONTINUE these medications which have NOT CHANGED   Details  aspirin 325 MG tablet Take 325 mg by mouth every 6 (six) hours as needed for mild pain.    escitalopram (LEXAPRO) 10 MG tablet Take 10 mg by mouth daily.    hydrochlorothiazide (MICROZIDE) 12.5 MG capsule Take 12.5 mg by mouth daily.    levothyroxine (SYNTHROID, LEVOTHROID) 100 MCG tablet Take 100 mcg by mouth daily.    promethazine (PHENERGAN) 25 MG tablet Take 25 mg by mouth every 6 (six) hours as needed for nausea or vomiting.       Allergies  Allergen Reactions  . Sulfa Antibiotics Itching   Follow-up Information    Follow up with Aretta Nip, MD. Go on 08/08/2015.   Specialty:  Family Medicine   Why:  Hospital follow up @ 2pm   Contact information:   [place] [address] [phone]        The results of significant diagnostics from this hospitalization (including imaging, microbiology, ancillary and laboratory) are listed below for reference.    Significant Diagnostic Studies: No results found.  Microbiology: Recent Results (from the past 240 hour(s))  MRSA PCR Screening     Status: None   Collection Time: 07/31/15  9:00 AM  Result Value Ref Range Status   MRSA by PCR NEGATIVE NEGATIVE Final    Comment:        The GeneXpert MRSA Assay (FDA approved for NASAL specimens only), is one component of a comprehensive MRSA colonization surveillance program. It is not intended to diagnose MRSA infection nor to guide or monitor treatment for MRSA infections.      Labs: Basic Metabolic Panel:  Recent Labs Lab 07/31/15 0307 07/31/15 0518  07/31/15 1114 07/31/15 1532 07/31/15 1820  NA 129* 134* 138 135 137  K 4.6 3.6 4.1 4.3 3.8  CL 92* 100* 105 104 104  CO2 '29 26 27 27 27  ' GLUCOSE 622* 390* 172* 302* 325*  BUN '18 15 10 9 7  ' CREATININE 0.94 0.80 0.59 0.67 0.67  CALCIUM 9.2 8.9 8.8* 8.7* 8.8*   Liver Function Tests:  Recent Labs Lab 07/30/15 2320  AST 17  ALT 24  ALKPHOS 109  BILITOT 1.0  PROT  7.6  ALBUMIN 4.1   CBC:  Recent Labs Lab 07/31/15 0307  WBC 6.6  HGB 12.1  HCT 34.0*  MCV 83.7  PLT 166    CBG:  Recent Labs Lab 07/31/15 1615 07/31/15 2046 08/01/15 0009 08/01/15 0023 08/01/15 0728  GLUCAP 286* 301* 131* 147* 288*    Signed:  Katie Vornheder PA-S [facility] 08/01/2015, 10:26 AM  Attending Attending MD note  Patient was seen, examined,treatment plan was discussed with the PA-S.  I have personally reviewed the clinical findings, lab, imaging studies and management of this patient in detail. I agree with the documentation, as recorded by the PA-S  CBGs relatively stable with just 10 units of Lantus-will increase Lantus to 15 units, have instructed patient to keep a log of her CBGs-and take this to her PCPs appointment. She has been counseled regarding the symptoms of hypoglycemia and appropriate intervention. I have instructed the RN to make sure patient is comfortable with injecting insulin prior to discharging her. We will also start on metformin. Further optimization of her diabetic regimen can be done in the outpatient setting-currently she is stable for discharge.  Nena Alexander MD   [facility]

## 2015-08-16 ENCOUNTER — Encounter: Payer: 59 | Attending: Family Medicine | Admitting: *Deleted

## 2015-08-16 ENCOUNTER — Encounter: Payer: Self-pay | Admitting: *Deleted

## 2015-08-16 VITALS — Ht 65.0 in | Wt 200.5 lb

## 2015-08-16 DIAGNOSIS — E119 Type 2 diabetes mellitus without complications: Secondary | ICD-10-CM | POA: Diagnosis present

## 2015-08-16 DIAGNOSIS — E1100 Type 2 diabetes mellitus with hyperosmolarity without nonketotic hyperglycemic-hyperosmolar coma (NKHHC): Secondary | ICD-10-CM

## 2015-08-16 NOTE — Patient Instructions (Addendum)
Plan:  Aim for 2-3 Carb Choices per meal (30-45 grams) +/- 1 either way  Aim for 0-15 Carbs per snack if hungry  Include protein in moderation with your meals and snacks Consider reading food labels for Total Carbohydrate and Fat Grams of foods Consider  increasing your activity level by walkint for 30 minutes daily as tolerated Consider checking BG at alternate times per day as directed by MD  continue taking medication as directed by MD  iPhone - [email or web address]  & My fitness Pal Droid - My Fitness Pal  Consider Brummel & Manson PasseyBrown as butter substitute Add Olive Oil and seeds to your diet

## 2015-08-21 ENCOUNTER — Encounter: Payer: Self-pay | Admitting: *Deleted

## 2015-08-21 NOTE — Progress Notes (Signed)
Diabetes Self-Management Education  Visit Type: First/Initial  Appt. Start Time: 1030 Appt. End Time: 1200  08/21/2015  Courtney Fernandez, identified by name and date of birth, is a 59 y.o. female with a diagnosis of Diabetes: Type 2.  Courtney Fernandez comes to [facility] for DSME following a recent hospitalization and new diagnosis of T2DM. She notes having made many dietary modifications and is looking into exercise options.  ASSESSMENT  Height 5\' 5"  (1.651 m), weight 200 lb 8 oz (90.946 kg). Body mass index is 33.36 kg/(m^2).      Diabetes Self-Management Education - 08/16/15 1050    Visit Information   Visit Type First/Initial   Initial Visit   Diabetes Type Type 2   Are you currently following a meal plan? Yes  Start eating Breakfast, Broaden food to include fruit & vegetables, more aware of carbs. Discontinue sweet tea'   Are you taking your medications as prescribed? Yes   Date Diagnosed 07/2015   Health Coping   How would you rate your overall health? Good   Psychosocial Assessment   Patient Belief/Attitude about Diabetes Motivated to manage diabetes   Self-care barriers None   Self-management support Doctor's office;CDE visits   Other persons present Patient   Patient Concerns Nutrition/Meal planning;Medication;Monitoring;Healthy Lifestyle;Glycemic Control   Special Needs None   Preferred Learning Style No preference indicated   Learning Readiness Change in progress   How often do you need to have someone help you when you read instructions, pamphlets, or other written materials from your doctor or pharmacy? 1 - Never   Complications   Last HgB A1C per patient/outside source 10.8 %   How often do you check your blood sugar? 3-4 times/day  fasting & 2hpp   Fasting Blood glucose range (mg/dL) >161>200  [phone]   Postprandial Blood glucose range (mg/dL) >409>200  [phone]   Number of hyperglycemic episodes per week 0   Have you had a dilated eye exam in the past 12 months? Yes   Have you had a dental exam in the past 12 months? Yes   Are you checking your feet? No   Dietary Intake   Breakfast raisin toast, peanut butter, coffee (fruit), egg mcmuffin no cheese / wheeties 3/4C, milk 1/2C 2%,  1/2C rasperies/    Lunch Apple pecan chicken salade [employer],   / pork roast, kale, butternut squash, strawberry yogurt with raspberries1/2C , Lorna Doone cookies # 2   Dinner Mal[place]urkey, corn bread stuffing, lima beans, water   Exercise   Exercise Type Light (walking / raking leaves)   How many days per week to you exercise? 1   How many minutes per day do you exercise? 30   Total minutes per week of exercise 30   Patient Education   Previous Diabetes Education Yes (please comment)  Diabetes Educators in Hospital   Disease state  Definition of diabetes, type 1 and 2, and the diagnosis of diabetes;Factors that contribute to the development of diabetes   Nutrition management  Role of diet in the treatment of diabetes and the relationship between the three main macronutrients and blood glucose level;Food label reading, portion sizes and measuring food.;Carbohydrate counting;Reviewed blood glucose goals for pre and post meals and how to evaluate the patients' food intake on their blood glucose level.;Information on hints to eating out and maintain blood glucose control.   Physical activity and exercise  Role of exercise on diabetes management, blood pressure control and cardiac health.   Medications Reviewed patients medication  for diabetes, action, purpose, timing of dose and side effects.   Monitoring Purpose and frequency of SMBG.;Identified appropriate SMBG and/or A1C goals.   Personal strategies to promote health Lifestyle issues that need to be addressed for better diabetes care   Individualized Goals (developed by patient)   Nutrition General guidelines for healthy choices and portions discussed   Physical Activity Exercise 5-7 days per week;30 minutes per day   Monitoring  test my  blood glucose as discussed   Outcomes   Expected Outcomes Demonstrated interest in learning. Expect positive outcomes   Future DMSE PRN   Program Status Completed      Individualized Plan for Diabetes Self-Management Training:   Learning Objective:  Patient will have a greater understanding of diabetes self-management. Patient education plan is to attend individual and/or group sessions per assessed needs and concerns.   Patient Instructions  Plan:  Aim for 2-3 Carb Choices per meal (30-45 grams) +/- 1 either way  Aim for 0-15 Carbs per snack if hungry  Include protein in moderation with your meals and snacks Consider reading food labels for Total Carbohydrate and Fat Grams of foods Consider  increasing your activity level by walkint for 30 minutes daily as tolerated Consider checking BG at alternate times per day as directed by MD  continue taking medication as directed by MD  iPhone - [email or web address]  & My fitness Pal Droid - My Fitness Pal  Consider Brummel & Brown as butter substitute Add Olive Oil and seeds to your diet   Expected Outcomes:  Demonstrated interest in learning. Expect positive outcomes  Education material provided: Living Well with Diabetes, A1C conversion sheet, Meal plan card, My Plate and Snack sheet  If problems or questions, patient to contact team via:  Phone  Future DSME appointment: PRN

## 2015-12-28 ENCOUNTER — Ambulatory Visit: Payer: Self-pay

## 2016-02-12 DIAGNOSIS — I10 Essential (primary) hypertension: Secondary | ICD-10-CM | POA: Diagnosis not present

## 2016-02-12 DIAGNOSIS — E782 Mixed hyperlipidemia: Secondary | ICD-10-CM | POA: Diagnosis not present

## 2016-02-12 DIAGNOSIS — E038 Other specified hypothyroidism: Secondary | ICD-10-CM | POA: Diagnosis not present

## 2016-02-12 DIAGNOSIS — E1065 Type 1 diabetes mellitus with hyperglycemia: Secondary | ICD-10-CM | POA: Diagnosis not present

## 2016-02-14 DIAGNOSIS — E109 Type 1 diabetes mellitus without complications: Secondary | ICD-10-CM | POA: Diagnosis not present

## 2016-02-29 DIAGNOSIS — E782 Mixed hyperlipidemia: Secondary | ICD-10-CM | POA: Diagnosis not present

## 2016-02-29 DIAGNOSIS — I10 Essential (primary) hypertension: Secondary | ICD-10-CM | POA: Diagnosis not present

## 2016-02-29 DIAGNOSIS — E038 Other specified hypothyroidism: Secondary | ICD-10-CM | POA: Diagnosis not present

## 2016-02-29 DIAGNOSIS — E1065 Type 1 diabetes mellitus with hyperglycemia: Secondary | ICD-10-CM | POA: Diagnosis not present

## 2016-03-26 DIAGNOSIS — E1065 Type 1 diabetes mellitus with hyperglycemia: Secondary | ICD-10-CM | POA: Diagnosis not present

## 2016-03-26 DIAGNOSIS — E782 Mixed hyperlipidemia: Secondary | ICD-10-CM | POA: Diagnosis not present

## 2016-03-28 DIAGNOSIS — E1065 Type 1 diabetes mellitus with hyperglycemia: Secondary | ICD-10-CM | POA: Diagnosis not present

## 2016-04-01 DIAGNOSIS — E1065 Type 1 diabetes mellitus with hyperglycemia: Secondary | ICD-10-CM | POA: Diagnosis not present

## 2016-04-01 DIAGNOSIS — E782 Mixed hyperlipidemia: Secondary | ICD-10-CM | POA: Diagnosis not present

## 2016-04-01 DIAGNOSIS — E038 Other specified hypothyroidism: Secondary | ICD-10-CM | POA: Diagnosis not present

## 2016-04-29 DIAGNOSIS — E038 Other specified hypothyroidism: Secondary | ICD-10-CM | POA: Diagnosis not present

## 2016-04-29 DIAGNOSIS — E1065 Type 1 diabetes mellitus with hyperglycemia: Secondary | ICD-10-CM | POA: Diagnosis not present

## 2016-04-29 DIAGNOSIS — I10 Essential (primary) hypertension: Secondary | ICD-10-CM | POA: Diagnosis not present

## 2016-04-29 DIAGNOSIS — E782 Mixed hyperlipidemia: Secondary | ICD-10-CM | POA: Diagnosis not present

## 2016-05-08 DIAGNOSIS — L298 Other pruritus: Secondary | ICD-10-CM | POA: Diagnosis not present

## 2016-05-08 DIAGNOSIS — L650 Telogen effluvium: Secondary | ICD-10-CM | POA: Diagnosis not present

## 2016-05-27 DIAGNOSIS — Z794 Long term (current) use of insulin: Secondary | ICD-10-CM | POA: Diagnosis not present

## 2016-05-27 DIAGNOSIS — K146 Glossodynia: Secondary | ICD-10-CM | POA: Diagnosis not present

## 2016-05-27 DIAGNOSIS — K1321 Leukoplakia of oral mucosa, including tongue: Secondary | ICD-10-CM | POA: Diagnosis not present

## 2016-05-27 DIAGNOSIS — E119 Type 2 diabetes mellitus without complications: Secondary | ICD-10-CM | POA: Diagnosis not present

## 2016-06-04 DIAGNOSIS — E1065 Type 1 diabetes mellitus with hyperglycemia: Secondary | ICD-10-CM | POA: Diagnosis not present

## 2016-06-24 DIAGNOSIS — E559 Vitamin D deficiency, unspecified: Secondary | ICD-10-CM | POA: Diagnosis not present

## 2016-06-24 DIAGNOSIS — E1065 Type 1 diabetes mellitus with hyperglycemia: Secondary | ICD-10-CM | POA: Diagnosis not present

## 2016-07-01 DIAGNOSIS — I10 Essential (primary) hypertension: Secondary | ICD-10-CM | POA: Diagnosis not present

## 2016-07-01 DIAGNOSIS — Z23 Encounter for immunization: Secondary | ICD-10-CM | POA: Diagnosis not present

## 2016-07-01 DIAGNOSIS — E038 Other specified hypothyroidism: Secondary | ICD-10-CM | POA: Diagnosis not present

## 2016-07-01 DIAGNOSIS — E782 Mixed hyperlipidemia: Secondary | ICD-10-CM | POA: Diagnosis not present

## 2016-07-01 DIAGNOSIS — E1065 Type 1 diabetes mellitus with hyperglycemia: Secondary | ICD-10-CM | POA: Diagnosis not present

## 2016-07-16 DIAGNOSIS — H01022 Squamous blepharitis right lower eyelid: Secondary | ICD-10-CM | POA: Diagnosis not present

## 2016-07-16 DIAGNOSIS — H2513 Age-related nuclear cataract, bilateral: Secondary | ICD-10-CM | POA: Diagnosis not present

## 2016-07-16 DIAGNOSIS — H01021 Squamous blepharitis right upper eyelid: Secondary | ICD-10-CM | POA: Diagnosis not present

## 2016-07-16 DIAGNOSIS — E119 Type 2 diabetes mellitus without complications: Secondary | ICD-10-CM | POA: Diagnosis not present

## 2016-08-06 DIAGNOSIS — E1065 Type 1 diabetes mellitus with hyperglycemia: Secondary | ICD-10-CM | POA: Diagnosis not present

## 2016-08-06 DIAGNOSIS — E559 Vitamin D deficiency, unspecified: Secondary | ICD-10-CM | POA: Diagnosis not present

## 2016-08-13 DIAGNOSIS — E1065 Type 1 diabetes mellitus with hyperglycemia: Secondary | ICD-10-CM | POA: Diagnosis not present

## 2016-08-13 DIAGNOSIS — E782 Mixed hyperlipidemia: Secondary | ICD-10-CM | POA: Diagnosis not present

## 2016-08-13 DIAGNOSIS — E038 Other specified hypothyroidism: Secondary | ICD-10-CM | POA: Diagnosis not present

## 2016-08-13 DIAGNOSIS — I10 Essential (primary) hypertension: Secondary | ICD-10-CM | POA: Diagnosis not present

## 2016-08-15 DIAGNOSIS — H2511 Age-related nuclear cataract, right eye: Secondary | ICD-10-CM | POA: Diagnosis not present

## 2016-08-18 DIAGNOSIS — H2512 Age-related nuclear cataract, left eye: Secondary | ICD-10-CM | POA: Diagnosis not present

## 2016-08-27 DIAGNOSIS — H2511 Age-related nuclear cataract, right eye: Secondary | ICD-10-CM | POA: Diagnosis not present

## 2016-10-14 DIAGNOSIS — E118 Type 2 diabetes mellitus with unspecified complications: Secondary | ICD-10-CM | POA: Diagnosis not present

## 2016-10-21 DIAGNOSIS — E782 Mixed hyperlipidemia: Secondary | ICD-10-CM | POA: Diagnosis not present

## 2016-10-21 DIAGNOSIS — E039 Hypothyroidism, unspecified: Secondary | ICD-10-CM | POA: Diagnosis not present

## 2016-10-21 DIAGNOSIS — I10 Essential (primary) hypertension: Secondary | ICD-10-CM | POA: Diagnosis not present

## 2016-10-21 DIAGNOSIS — E1065 Type 1 diabetes mellitus with hyperglycemia: Secondary | ICD-10-CM | POA: Diagnosis not present

## 2017-01-09 DIAGNOSIS — E1065 Type 1 diabetes mellitus with hyperglycemia: Secondary | ICD-10-CM | POA: Diagnosis not present

## 2017-01-09 DIAGNOSIS — E782 Mixed hyperlipidemia: Secondary | ICD-10-CM | POA: Diagnosis not present

## 2017-01-20 DIAGNOSIS — E1065 Type 1 diabetes mellitus with hyperglycemia: Secondary | ICD-10-CM | POA: Diagnosis not present

## 2017-01-20 DIAGNOSIS — E782 Mixed hyperlipidemia: Secondary | ICD-10-CM | POA: Diagnosis not present

## 2017-01-20 DIAGNOSIS — I10 Essential (primary) hypertension: Secondary | ICD-10-CM | POA: Diagnosis not present

## 2017-01-20 DIAGNOSIS — E039 Hypothyroidism, unspecified: Secondary | ICD-10-CM | POA: Diagnosis not present

## 2017-04-15 DIAGNOSIS — E1065 Type 1 diabetes mellitus with hyperglycemia: Secondary | ICD-10-CM | POA: Diagnosis not present

## 2017-04-16 DIAGNOSIS — E559 Vitamin D deficiency, unspecified: Secondary | ICD-10-CM | POA: Diagnosis not present

## 2017-04-16 DIAGNOSIS — E039 Hypothyroidism, unspecified: Secondary | ICD-10-CM | POA: Diagnosis not present

## 2017-04-16 DIAGNOSIS — E782 Mixed hyperlipidemia: Secondary | ICD-10-CM | POA: Diagnosis not present

## 2017-04-16 DIAGNOSIS — E1065 Type 1 diabetes mellitus with hyperglycemia: Secondary | ICD-10-CM | POA: Diagnosis not present

## 2017-04-21 DIAGNOSIS — E1065 Type 1 diabetes mellitus with hyperglycemia: Secondary | ICD-10-CM | POA: Diagnosis not present

## 2017-04-21 DIAGNOSIS — E039 Hypothyroidism, unspecified: Secondary | ICD-10-CM | POA: Diagnosis not present

## 2017-04-21 DIAGNOSIS — E782 Mixed hyperlipidemia: Secondary | ICD-10-CM | POA: Diagnosis not present

## 2017-04-21 DIAGNOSIS — I10 Essential (primary) hypertension: Secondary | ICD-10-CM | POA: Diagnosis not present

## 2017-05-01 DIAGNOSIS — I10 Essential (primary) hypertension: Secondary | ICD-10-CM | POA: Diagnosis not present

## 2017-05-01 DIAGNOSIS — E038 Other specified hypothyroidism: Secondary | ICD-10-CM | POA: Diagnosis not present

## 2017-05-01 DIAGNOSIS — Z1389 Encounter for screening for other disorder: Secondary | ICD-10-CM | POA: Diagnosis not present

## 2017-05-01 DIAGNOSIS — E109 Type 1 diabetes mellitus without complications: Secondary | ICD-10-CM | POA: Diagnosis not present

## 2017-05-01 DIAGNOSIS — F329 Major depressive disorder, single episode, unspecified: Secondary | ICD-10-CM | POA: Diagnosis not present

## 2017-05-14 DIAGNOSIS — L930 Discoid lupus erythematosus: Secondary | ICD-10-CM | POA: Diagnosis not present

## 2017-05-19 DIAGNOSIS — L932 Other local lupus erythematosus: Secondary | ICD-10-CM | POA: Diagnosis not present

## 2017-06-22 DIAGNOSIS — E1065 Type 1 diabetes mellitus with hyperglycemia: Secondary | ICD-10-CM | POA: Diagnosis not present

## 2017-07-24 DIAGNOSIS — E1065 Type 1 diabetes mellitus with hyperglycemia: Secondary | ICD-10-CM | POA: Diagnosis not present

## 2017-07-29 DIAGNOSIS — L930 Discoid lupus erythematosus: Secondary | ICD-10-CM | POA: Diagnosis not present

## 2017-07-29 DIAGNOSIS — R768 Other specified abnormal immunological findings in serum: Secondary | ICD-10-CM | POA: Diagnosis not present

## 2017-07-29 DIAGNOSIS — M2550 Pain in unspecified joint: Secondary | ICD-10-CM | POA: Diagnosis not present

## 2017-08-19 DIAGNOSIS — R768 Other specified abnormal immunological findings in serum: Secondary | ICD-10-CM | POA: Diagnosis not present

## 2017-08-19 DIAGNOSIS — M359 Systemic involvement of connective tissue, unspecified: Secondary | ICD-10-CM | POA: Diagnosis not present

## 2017-08-19 DIAGNOSIS — L930 Discoid lupus erythematosus: Secondary | ICD-10-CM | POA: Diagnosis not present

## 2017-08-20 DIAGNOSIS — E1065 Type 1 diabetes mellitus with hyperglycemia: Secondary | ICD-10-CM | POA: Diagnosis not present

## 2017-09-15 DIAGNOSIS — E7849 Other hyperlipidemia: Secondary | ICD-10-CM | POA: Diagnosis not present

## 2017-09-15 DIAGNOSIS — E038 Other specified hypothyroidism: Secondary | ICD-10-CM | POA: Diagnosis not present

## 2017-09-15 DIAGNOSIS — Z1389 Encounter for screening for other disorder: Secondary | ICD-10-CM | POA: Diagnosis not present

## 2017-09-15 DIAGNOSIS — I10 Essential (primary) hypertension: Secondary | ICD-10-CM | POA: Diagnosis not present

## 2017-09-15 DIAGNOSIS — G4733 Obstructive sleep apnea (adult) (pediatric): Secondary | ICD-10-CM | POA: Diagnosis not present

## 2017-09-15 DIAGNOSIS — E109 Type 1 diabetes mellitus without complications: Secondary | ICD-10-CM | POA: Diagnosis not present

## 2017-09-24 DIAGNOSIS — E1065 Type 1 diabetes mellitus with hyperglycemia: Secondary | ICD-10-CM | POA: Diagnosis not present

## 2017-11-06 DIAGNOSIS — E1065 Type 1 diabetes mellitus with hyperglycemia: Secondary | ICD-10-CM | POA: Diagnosis not present

## 2017-12-24 DIAGNOSIS — E1065 Type 1 diabetes mellitus with hyperglycemia: Secondary | ICD-10-CM | POA: Diagnosis not present

## 2018-01-19 DIAGNOSIS — L930 Discoid lupus erythematosus: Secondary | ICD-10-CM | POA: Diagnosis not present

## 2018-01-19 DIAGNOSIS — E7849 Other hyperlipidemia: Secondary | ICD-10-CM | POA: Diagnosis not present

## 2018-01-19 DIAGNOSIS — F329 Major depressive disorder, single episode, unspecified: Secondary | ICD-10-CM | POA: Diagnosis not present

## 2018-01-19 DIAGNOSIS — E109 Type 1 diabetes mellitus without complications: Secondary | ICD-10-CM | POA: Diagnosis not present

## 2018-02-22 DIAGNOSIS — E1065 Type 1 diabetes mellitus with hyperglycemia: Secondary | ICD-10-CM | POA: Diagnosis not present

## 2018-03-10 DIAGNOSIS — E109 Type 1 diabetes mellitus without complications: Secondary | ICD-10-CM | POA: Diagnosis not present

## 2018-03-10 DIAGNOSIS — K118 Other diseases of salivary glands: Secondary | ICD-10-CM | POA: Diagnosis not present

## 2018-03-10 DIAGNOSIS — E038 Other specified hypothyroidism: Secondary | ICD-10-CM | POA: Diagnosis not present

## 2018-03-10 DIAGNOSIS — I10 Essential (primary) hypertension: Secondary | ICD-10-CM | POA: Diagnosis not present

## 2018-03-18 DIAGNOSIS — E1065 Type 1 diabetes mellitus with hyperglycemia: Secondary | ICD-10-CM | POA: Diagnosis not present

## 2018-05-18 DIAGNOSIS — E1065 Type 1 diabetes mellitus with hyperglycemia: Secondary | ICD-10-CM | POA: Diagnosis not present

## 2018-05-19 DIAGNOSIS — E1065 Type 1 diabetes mellitus with hyperglycemia: Secondary | ICD-10-CM | POA: Diagnosis not present

## 2018-05-27 DIAGNOSIS — H40013 Open angle with borderline findings, low risk, bilateral: Secondary | ICD-10-CM | POA: Diagnosis not present

## 2018-05-27 DIAGNOSIS — H04123 Dry eye syndrome of bilateral lacrimal glands: Secondary | ICD-10-CM | POA: Diagnosis not present

## 2018-05-27 DIAGNOSIS — H26493 Other secondary cataract, bilateral: Secondary | ICD-10-CM | POA: Diagnosis not present

## 2018-05-27 DIAGNOSIS — E109 Type 1 diabetes mellitus without complications: Secondary | ICD-10-CM | POA: Diagnosis not present

## 2018-06-14 DIAGNOSIS — K118 Other diseases of salivary glands: Secondary | ICD-10-CM | POA: Diagnosis not present

## 2018-06-14 DIAGNOSIS — E7849 Other hyperlipidemia: Secondary | ICD-10-CM | POA: Diagnosis not present

## 2018-06-14 DIAGNOSIS — I10 Essential (primary) hypertension: Secondary | ICD-10-CM | POA: Diagnosis not present

## 2018-06-14 DIAGNOSIS — L930 Discoid lupus erythematosus: Secondary | ICD-10-CM | POA: Diagnosis not present

## 2018-06-16 DIAGNOSIS — H26493 Other secondary cataract, bilateral: Secondary | ICD-10-CM | POA: Diagnosis not present

## 2018-07-06 DIAGNOSIS — R220 Localized swelling, mass and lump, head: Secondary | ICD-10-CM | POA: Diagnosis not present

## 2018-07-08 DIAGNOSIS — Z961 Presence of intraocular lens: Secondary | ICD-10-CM | POA: Diagnosis not present

## 2018-07-08 DIAGNOSIS — H40013 Open angle with borderline findings, low risk, bilateral: Secondary | ICD-10-CM | POA: Diagnosis not present

## 2018-07-08 DIAGNOSIS — H26491 Other secondary cataract, right eye: Secondary | ICD-10-CM | POA: Diagnosis not present

## 2018-07-19 DIAGNOSIS — E1065 Type 1 diabetes mellitus with hyperglycemia: Secondary | ICD-10-CM | POA: Diagnosis not present

## 2018-08-16 DIAGNOSIS — D485 Neoplasm of uncertain behavior of skin: Secondary | ICD-10-CM | POA: Diagnosis not present

## 2018-08-18 DIAGNOSIS — E1065 Type 1 diabetes mellitus with hyperglycemia: Secondary | ICD-10-CM | POA: Diagnosis not present

## 2018-08-20 ENCOUNTER — Other Ambulatory Visit: Payer: Self-pay | Admitting: Otolaryngology

## 2018-08-20 DIAGNOSIS — R220 Localized swelling, mass and lump, head: Secondary | ICD-10-CM

## 2018-08-24 ENCOUNTER — Ambulatory Visit
Admission: RE | Admit: 2018-08-24 | Discharge: 2018-08-24 | Disposition: A | Discharge status: Home / Self Care | Payer: BLUE CROSS/BLUE SHIELD | Source: Ambulatory Visit | Attending: Otolaryngology | Admitting: Otolaryngology

## 2018-08-24 DIAGNOSIS — R220 Localized swelling, mass and lump, head: Secondary | ICD-10-CM | POA: Diagnosis not present

## 2018-08-24 MED ORDER — IOPAMIDOL (ISOVUE-300) INJECTION 61%
75.0000 mL | Freq: Once | INTRAVENOUS | Status: AC | PRN
  Administered 2018-08-24: 75 mL via INTRAVENOUS

## 2018-08-30 DIAGNOSIS — D3709 Neoplasm of uncertain behavior of other specified sites of the oral cavity: Secondary | ICD-10-CM | POA: Diagnosis not present

## 2018-12-03 DIAGNOSIS — E1065 Type 1 diabetes mellitus with hyperglycemia: Secondary | ICD-10-CM | POA: Diagnosis not present

## 2018-12-17 DIAGNOSIS — E1065 Type 1 diabetes mellitus with hyperglycemia: Secondary | ICD-10-CM | POA: Diagnosis not present

## 2019-01-18 DIAGNOSIS — M25562 Pain in left knee: Secondary | ICD-10-CM | POA: Diagnosis not present

## 2019-02-09 DIAGNOSIS — E1065 Type 1 diabetes mellitus with hyperglycemia: Secondary | ICD-10-CM | POA: Diagnosis not present

## 2019-03-07 DIAGNOSIS — E1065 Type 1 diabetes mellitus with hyperglycemia: Secondary | ICD-10-CM | POA: Diagnosis not present

## 2019-03-11 DIAGNOSIS — E1065 Type 1 diabetes mellitus with hyperglycemia: Secondary | ICD-10-CM | POA: Diagnosis not present

## 2019-05-09 DIAGNOSIS — E1065 Type 1 diabetes mellitus with hyperglycemia: Secondary | ICD-10-CM | POA: Diagnosis not present

## 2019-06-15 ENCOUNTER — Encounter (HOSPITAL_COMMUNITY): Payer: Self-pay

## 2019-06-15 ENCOUNTER — Other Ambulatory Visit: Payer: Self-pay

## 2019-06-15 DIAGNOSIS — R112 Nausea with vomiting, unspecified: Secondary | ICD-10-CM | POA: Diagnosis not present

## 2019-06-15 DIAGNOSIS — Z5321 Procedure and treatment not carried out due to patient leaving prior to being seen by health care provider: Secondary | ICD-10-CM | POA: Diagnosis not present

## 2019-06-15 DIAGNOSIS — R519 Headache, unspecified: Secondary | ICD-10-CM | POA: Diagnosis not present

## 2019-06-15 LAB — I-STAT BETA HCG BLOOD, ED (MC, WL, AP ONLY): I-stat hCG, quantitative: <5 m[IU]/mL (ref <5)

## 2019-06-15 LAB — CBC
HCT: 41.1 % (ref 36.0–46.0)
Hemoglobin: 13.6 g/dL (ref 12.0–15.0)
MCH: 30 pg (ref 26.0–34.0)
MCHC: 33.1 g/dL (ref 30.0–36.0)
MCV: 90.5 fL (ref 80.0–100.0)
Platelets: 203 10*3/uL (ref 150–400)
RBC: 4.54 MIL/uL (ref 3.87–5.11)
RDW: 12.3 % (ref 11.5–15.5)
WBC: 7 10*3/uL (ref 4.0–10.5)
nRBC: 0 % (ref 0.0–0.2)

## 2019-06-15 LAB — COMPREHENSIVE METABOLIC PANEL
ALT: 18 U/L (ref 0–44)
AST: 22 U/L (ref 15–41)
Albumin: 4.3 g/dL (ref 3.5–5.0)
Alkaline Phosphatase: 75 U/L (ref 38–126)
Anion gap: 9 (ref 5–15)
BUN: 16 mg/dL (ref 6–20)
CO2: 27 mmol/L (ref 22–32)
Calcium: 9.8 mg/dL (ref 8.9–10.3)
Chloride: 101 mmol/L (ref 98–111)
Creatinine, Ser: 0.83 mg/dL (ref 0.44–1.00)
GFR calc Af Amer: >60 mL/min (ref >60)
GFR calc non Af Amer: >60 mL/min (ref >60)
Glucose, Bld: 246 mg/dL — ABNORMAL HIGH (ref 70–99)
Potassium: 3.6 mmol/L (ref 3.5–5.1)
Sodium: 137 mmol/L (ref 135–145)
Total Bilirubin: 1.1 mg/dL (ref 0.3–1.2)
Total Protein: 7.9 g/dL (ref 6.5–8.1)

## 2019-06-15 LAB — LIPASE, BLOOD: Lipase: 19 U/L (ref 11–51)

## 2019-06-15 MED ORDER — SODIUM CHLORIDE 0.9% FLUSH: 3.0000 mL | Freq: Once | INTRAVENOUS | Status: DC

## 2019-06-15 MED ORDER — ONDANSETRON 4 MG PO TBDP
4.0000 mg | ORAL_TABLET | Freq: Once | ORAL | Status: AC | PRN
  Administered 2019-06-15: 4 mg via ORAL
  Filled 2019-06-15: qty 1

## 2019-06-15 NOTE — ED Triage Notes (Signed)
Pt c/o sever headache with N/V and sensitivity to light that started this afternoon at 1730. Pt denies cough, SHOB or fever.

## 2019-06-16 ENCOUNTER — Emergency Department (HOSPITAL_COMMUNITY)
Admission: EM | Admit: 2019-06-16 | Discharge: 2019-06-16 | Disposition: A | Discharge status: Home / Self Care | Payer: BLUE CROSS/BLUE SHIELD | Source: Home / Self Care

## 2019-06-16 ENCOUNTER — Emergency Department (HOSPITAL_COMMUNITY): Payer: BLUE CROSS/BLUE SHIELD

## 2019-06-16 ENCOUNTER — Other Ambulatory Visit: Payer: Self-pay

## 2019-06-16 ENCOUNTER — Emergency Department (HOSPITAL_COMMUNITY)
Admission: EM | Admit: 2019-06-16 | Discharge: 2019-06-16 | Disposition: A | Discharge status: Home / Self Care | Payer: BLUE CROSS/BLUE SHIELD | Attending: Emergency Medicine | Admitting: Emergency Medicine

## 2019-06-16 ENCOUNTER — Encounter (HOSPITAL_COMMUNITY): Payer: Self-pay

## 2019-06-16 DIAGNOSIS — Z5321 Procedure and treatment not carried out due to patient leaving prior to being seen by health care provider: Secondary | ICD-10-CM | POA: Insufficient documentation

## 2019-06-16 DIAGNOSIS — R200 Anesthesia of skin: Secondary | ICD-10-CM | POA: Insufficient documentation

## 2019-06-16 DIAGNOSIS — R112 Nausea with vomiting, unspecified: Secondary | ICD-10-CM | POA: Insufficient documentation

## 2019-06-16 DIAGNOSIS — R519 Headache, unspecified: Secondary | ICD-10-CM | POA: Diagnosis not present

## 2019-06-16 LAB — URINALYSIS, ROUTINE W REFLEX MICROSCOPIC
Bacteria, UA: NONE SEEN
Bilirubin Urine: NEGATIVE
Glucose, UA: 150 mg/dL — AB
Hgb urine dipstick: NEGATIVE
Ketones, ur: 20 mg/dL — AB
Leukocytes,Ua: NEGATIVE
Nitrite: NEGATIVE
Protein, ur: 100 mg/dL — AB
Specific Gravity, Urine: 1.029 (ref 1.005–1.030)
pH: 6 (ref 5.0–8.0)

## 2019-06-16 LAB — CBG MONITORING, ED: Glucose-Capillary: 231 mg/dL — ABNORMAL HIGH (ref 70–99)

## 2019-06-16 MED ORDER — SODIUM CHLORIDE 0.9% FLUSH: 3.0000 mL | Freq: Once | INTRAVENOUS | Status: DC

## 2019-06-16 NOTE — ED Triage Notes (Signed)
Pt reports sudden onset severe headache around 6pm tonight along with n/v. Pt also reports bilateral hand numbness. Pt ill appearing in triage. Pt triaged at [facility] and [facility] due to wait time.

## 2019-06-16 NOTE — ED Notes (Signed)
Pt told registration she was leaving.  

## 2019-06-17 DIAGNOSIS — E1065 Type 1 diabetes mellitus with hyperglycemia: Secondary | ICD-10-CM | POA: Diagnosis not present

## 2019-06-20 DIAGNOSIS — E109 Type 1 diabetes mellitus without complications: Secondary | ICD-10-CM | POA: Diagnosis not present

## 2019-06-20 DIAGNOSIS — E039 Hypothyroidism, unspecified: Secondary | ICD-10-CM | POA: Diagnosis not present

## 2019-06-20 DIAGNOSIS — E785 Hyperlipidemia, unspecified: Secondary | ICD-10-CM | POA: Diagnosis not present

## 2019-06-20 DIAGNOSIS — L930 Discoid lupus erythematosus: Secondary | ICD-10-CM | POA: Diagnosis not present

## 2019-06-21 DIAGNOSIS — E039 Hypothyroidism, unspecified: Secondary | ICD-10-CM | POA: Diagnosis not present

## 2019-06-21 DIAGNOSIS — E559 Vitamin D deficiency, unspecified: Secondary | ICD-10-CM | POA: Diagnosis not present

## 2019-06-21 DIAGNOSIS — E109 Type 1 diabetes mellitus without complications: Secondary | ICD-10-CM | POA: Diagnosis not present

## 2019-06-24 ENCOUNTER — Encounter (INDEPENDENT_AMBULATORY_CARE_PROVIDER_SITE_OTHER): Payer: Self-pay

## 2019-07-04 DIAGNOSIS — E1065 Type 1 diabetes mellitus with hyperglycemia: Secondary | ICD-10-CM | POA: Diagnosis not present

## 2019-07-13 DIAGNOSIS — E109 Type 1 diabetes mellitus without complications: Secondary | ICD-10-CM | POA: Diagnosis not present

## 2019-07-13 DIAGNOSIS — Z961 Presence of intraocular lens: Secondary | ICD-10-CM | POA: Diagnosis not present

## 2019-07-13 DIAGNOSIS — H40013 Open angle with borderline findings, low risk, bilateral: Secondary | ICD-10-CM | POA: Diagnosis not present

## 2019-07-19 DIAGNOSIS — E109 Type 1 diabetes mellitus without complications: Secondary | ICD-10-CM | POA: Diagnosis not present

## 2019-07-19 DIAGNOSIS — Z794 Long term (current) use of insulin: Secondary | ICD-10-CM | POA: Diagnosis not present

## 2019-07-19 DIAGNOSIS — I10 Essential (primary) hypertension: Secondary | ICD-10-CM | POA: Diagnosis not present

## 2019-08-03 DIAGNOSIS — E559 Vitamin D deficiency, unspecified: Secondary | ICD-10-CM | POA: Diagnosis not present

## 2019-08-03 DIAGNOSIS — E039 Hypothyroidism, unspecified: Secondary | ICD-10-CM | POA: Diagnosis not present

## 2019-08-03 DIAGNOSIS — E109 Type 1 diabetes mellitus without complications: Secondary | ICD-10-CM | POA: Diagnosis not present

## 2019-08-03 DIAGNOSIS — Z1331 Encounter for screening for depression: Secondary | ICD-10-CM | POA: Diagnosis not present

## 2019-08-03 DIAGNOSIS — I10 Essential (primary) hypertension: Secondary | ICD-10-CM | POA: Diagnosis not present

## 2019-08-09 ENCOUNTER — Telehealth (HOSPITAL_COMMUNITY): Payer: Self-pay

## 2019-08-09 ENCOUNTER — Other Ambulatory Visit (HOSPITAL_COMMUNITY): Payer: Self-pay | Admitting: Endocrinology

## 2019-08-09 DIAGNOSIS — I779 Disorder of arteries and arterioles, unspecified: Secondary | ICD-10-CM

## 2019-08-09 NOTE — Telephone Encounter (Signed)

## 2019-08-10 ENCOUNTER — Ambulatory Visit (HOSPITAL_COMMUNITY)
Admission: RE | Admit: 2019-08-10 | Discharge: 2019-08-10 | Disposition: A | Discharge status: Home / Self Care | Payer: BLUE CROSS/BLUE SHIELD | Source: Ambulatory Visit | Attending: Endocrinology | Admitting: Endocrinology

## 2019-08-10 ENCOUNTER — Other Ambulatory Visit: Payer: Self-pay

## 2019-08-10 DIAGNOSIS — I779 Disorder of arteries and arterioles, unspecified: Secondary | ICD-10-CM | POA: Insufficient documentation

## 2020-10-15 IMAGING — CT CT MAXILLOFACIAL W/ CM
3 of 6 series · 14 of 47 positions shown, 17 images · IV contrast (iopamidol)
Comparison: Neck CT 06/15/2008 and 10/03/2002.

CLINICAL DATA: 62-year-old female with mass on the right cheek
discovered 4 months ago. Facial swelling. No known malignancy.

EXAM:
CT MAXILLOFACIAL WITH CONTRAST
TECHNIQUE: Multidetector CT imaging of the maxillofacial structures was
performed with intravenous contrast. Multiplanar CT image
reconstructions were also generated.
CONTRAST:  75mL QD24IW-VAA IOPAMIDOL (QD24IW-VAA) INJECTION 61%

[Series 2: maxillofacial 2.00 hr60 s3 · axial · 0.34mm/px · z∈[-682,-540]mm · 9 of 83 slices shown, 12 images]
[im 6/83  brain]
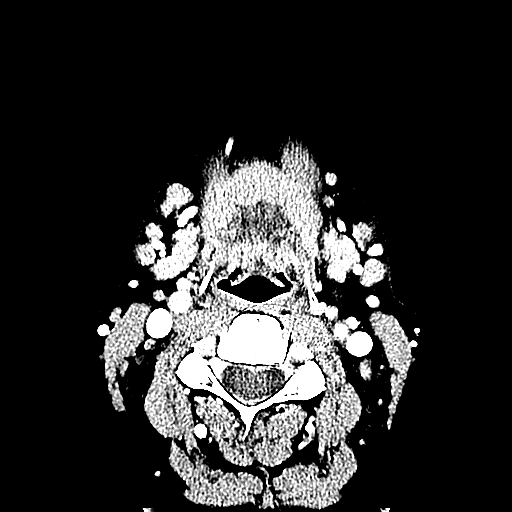
[im 6/83  bone]
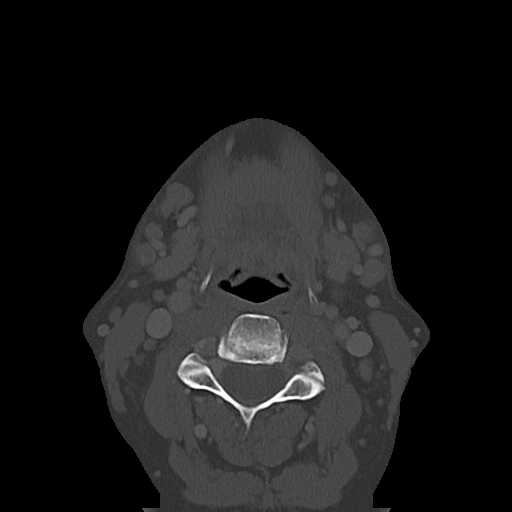
[im 18/83  bone]
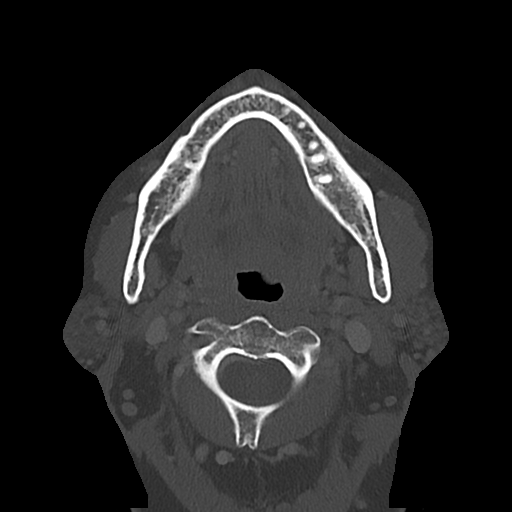
[im 24/83  bone]
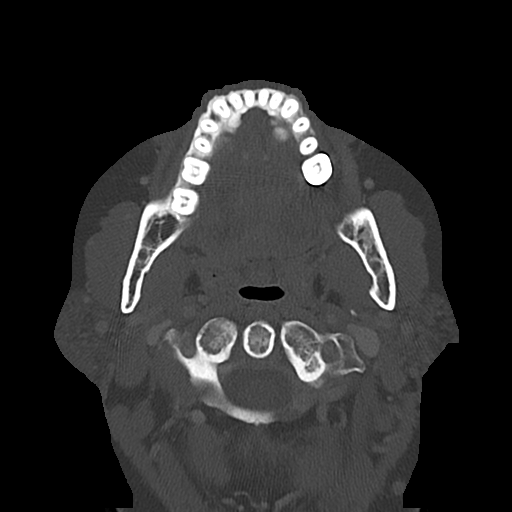
[im 36/83  bone]
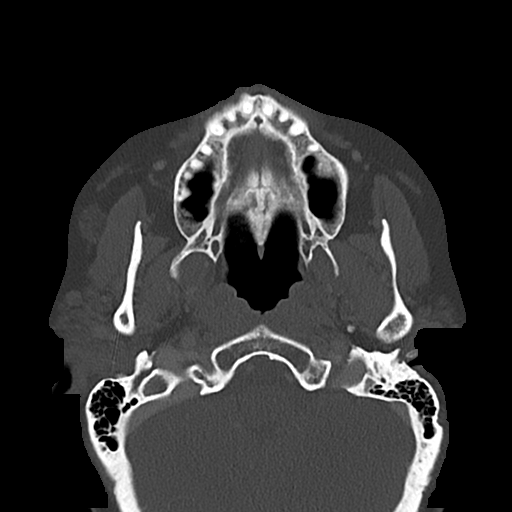
[im 42/83  brain]
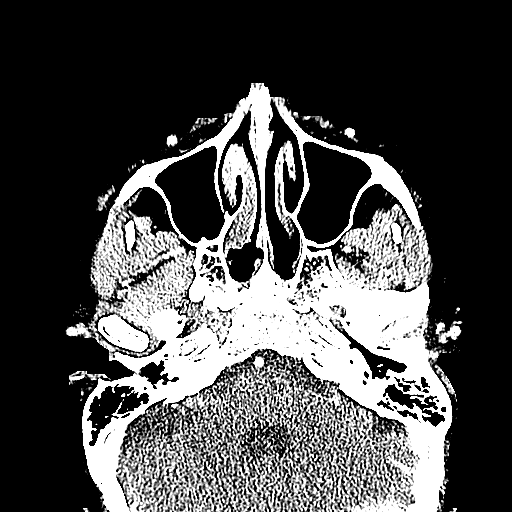
[im 42/83  bone]
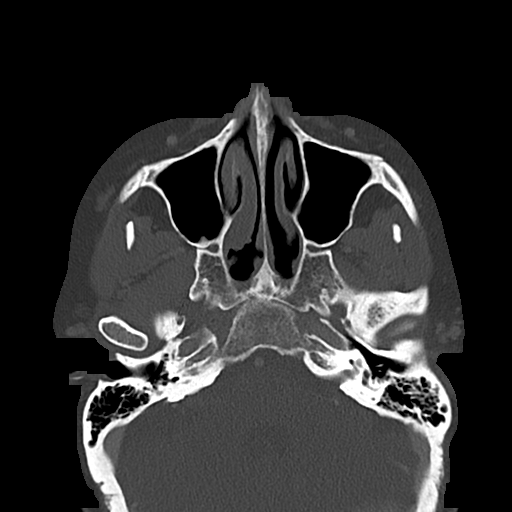
[im 47/83  bone]
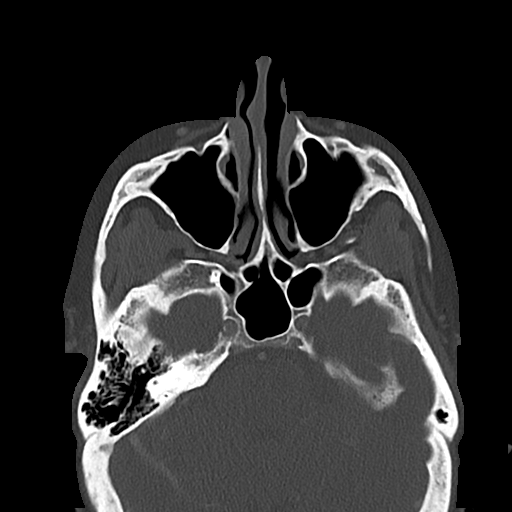
[im 59/83  bone]
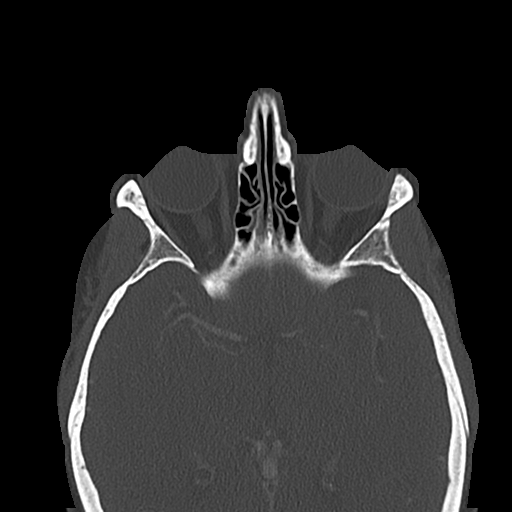
[im 65/83  bone]
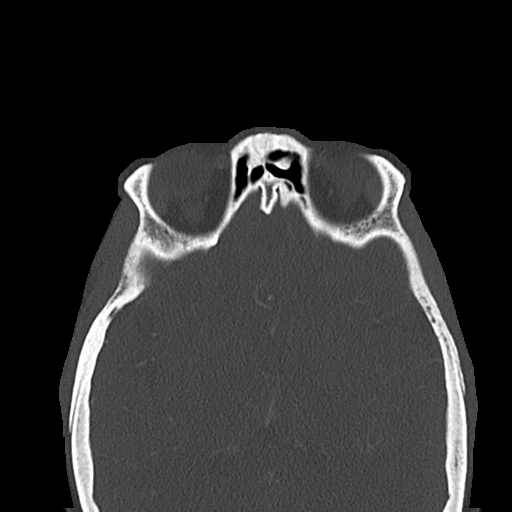
[im 77/83  brain]
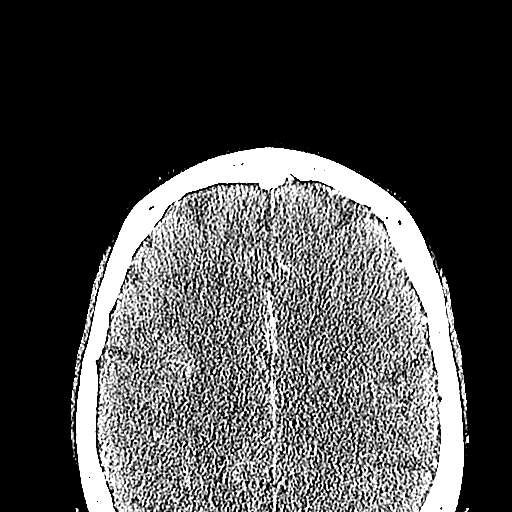
[im 77/83  bone]
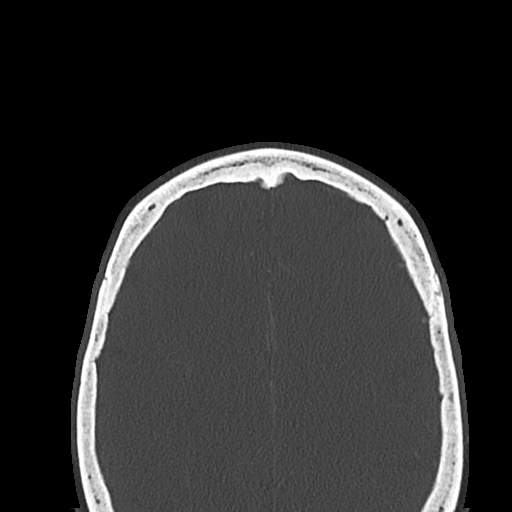

[Series 6: maxillofacial 2.00 hr60 s3 cor · coronal · 0.32mm/px · 3 of 87 slices shown]
[im 22/87  bone]
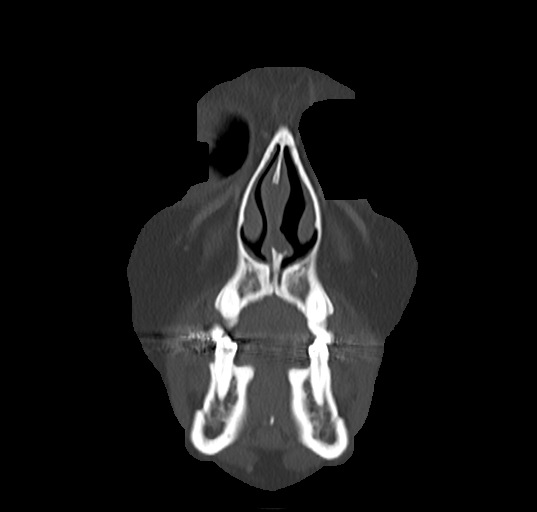
[im 44/87  bone]
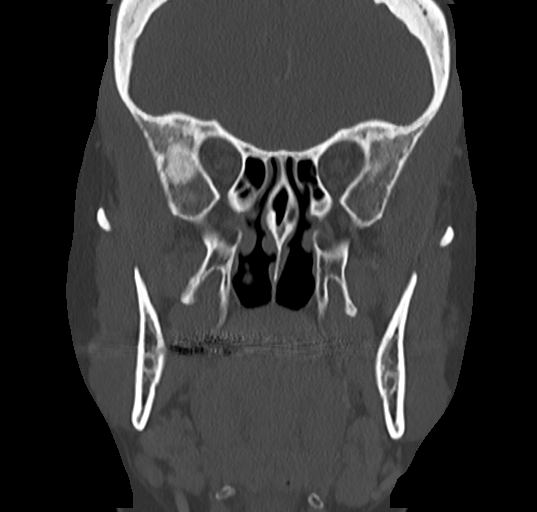
[im 65/87  bone]
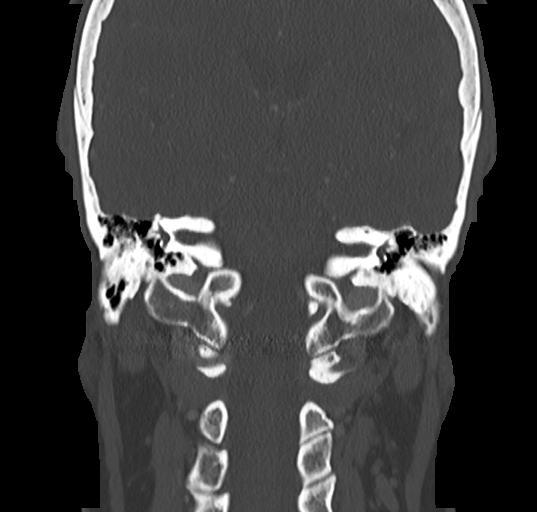

[Series 12: maxillofacial 2.00 hr40 s3 sag · sagittal · 0.32mm/px · 2 of 87 slices shown]
[im 29/87  bone]
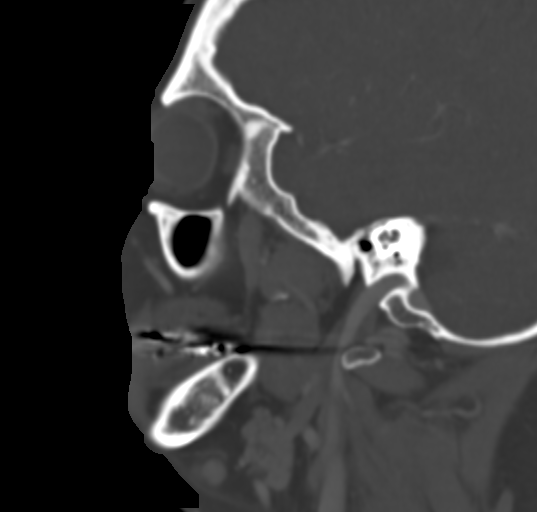
[im 58/87  bone]
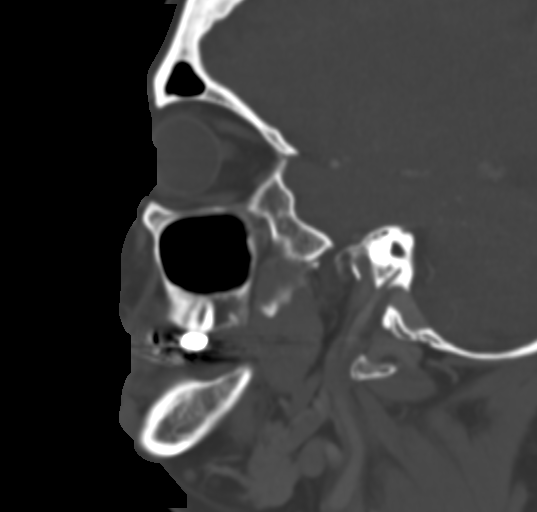

[14 of 47 positions shown; findings below may reference images not displayed]

FINDINGS: Osseous: Intact mandible. No significant dental findings. Maxilla,
zygoma, nasal bones and other facial bones appear intact.

Orbits: Intact orbital walls. Normal orbit soft tissues aside from
post cataract surgery changes.

Sinuses: Visualized paranasal sinuses and mastoids are stable and
well pneumatized. Tympanic cavities are clear.

Soft tissues: Marked area of clinical concern along the right cheek
inferior to the zygoma on series 3, image 54. Deep to the skin
marker and just anterior to the right facial artery there is a small
oval approximately 8 x 10 millimeter intermediate density soft
tissue mass best seen on series 3, image 54. This is new since the
8998 CT appears fairly circumscribed, but the inferior margin is
obscured by dental streak artifact (see coronal image 27). The
adjacent vessel appears normal. No regional soft tissue stranding.
This is anterior to the level of the right parotid duct insertion,
which appears unremarkable. The other regional soft tissues appear
stable since 8998 and within normal limits.

Negative visible pharynx, parapharyngeal spaces, retropharyngeal
space, sublingual space. The bilateral submandibular and parotid
glands have a nodular parenchymal appearance which is chronic but
progressed since 8998. This is most pronounced in the left parotid
gland. No discrete salivary gland mass.

Numerous but small bilateral level 1 lymph nodes appear stable since
8998 measuring up to 8 millimeters short axis. No heterogeneous
level 1 nodes. Level 2A lymph nodes also appear stable up to 9
millimeters short axis. Lymph node number although more than average
does not appear increased since 8998.

Limited intracranial: Visible brain parenchyma appears normal for
age. Calcified atherosclerosis at the skull base. Major vascular
structures in the neck and at the skull base are patent.
IMPRESSION: 1. Small oval 8 x 10 mm soft tissue mass deep to the marked area of
clinical concern is located just anterior to the right facial artery
(external maxillary artery) with intermediate density and no
surrounding inflammation.
This is new since a 8998 Neck CT and nonspecific.
A small peripheral nerve sheath tumor would be possible. The lesion
does not resemble a vascular malformation, although a small venous
varix would also be possible.
Given the small size and indeterminate nature imaging surveillance
may be most appropriate. Focused Ultrasound with Doppler
characterization might be valuable.
2. Otherwise stable and negative face soft tissues since [DATE]. Bilateral level 1 and upper cervical lymph nodes are chronically
increased in number but remain normal by size criteria and stable
since [DATE]. Chronic but increased nodular appearance of the salivary gland
suggesting a chronic systemic inflammatory process such as Abe
Disease.

## 2021-02-14 ENCOUNTER — Ambulatory Visit (INDEPENDENT_AMBULATORY_CARE_PROVIDER_SITE_OTHER): Payer: 59 | Admitting: Physician Assistant

## 2021-02-14 ENCOUNTER — Ambulatory Visit: Payer: Self-pay

## 2021-02-14 ENCOUNTER — Encounter: Payer: Self-pay | Admitting: Physician Assistant

## 2021-02-14 DIAGNOSIS — M25562 Pain in left knee: Secondary | ICD-10-CM | POA: Diagnosis not present

## 2021-02-14 MED ORDER — TRAMADOL HCL 50 MG PO TABS: 50.0000 mg | ORAL_TABLET | Freq: Four times a day (QID) | ORAL | 0 refills | Status: DC | PRN

## 2021-02-14 NOTE — Progress Notes (Signed)
Office Visit Note   Patient: Courtney Fernandez           Date of Birth: 1956/03/07           MRN: [id number] Visit Date: 02/14/2021              Requested by: Adrian Prince, MD [address] [place],  [place] [address] PCP: Adrian Prince, MD   Assessment & Plan: Visit Diagnoses:  1. Acute pain of left knee     Plan: Left knee is aspirated total of 10 cc of yellow aspirate obtained.  Patient tolerates well.  Ace bandage is applied she will remove this later this evening.  Quad strengthening exercises shown.  Ice is recommended to be applied to the knee 2 times daily for 15 to 20 minutes.  She will apply Voltaren gel 4 g 4 times daily to the left knee.  She may use Tylenol during the day for pain.  She is given tramadol for severe pain and for the nighttime pain.  We will see her back in 2 weeks to see how she is responding.  Questions were encouraged and answered at length.  Follow-Up Instructions: Return in about 2 weeks (around 02/28/2021).   Orders:  Orders Placed This Encounter  Procedures   XR Knee 1-2 Views Left   Meds ordered this encounter  Medications   traMADol (ULTRAM) 50 MG tablet    Sig: Take 1 tablet (50 mg total) by mouth every 6 (six) hours as needed.    Dispense:  20 tablet    Refill:  0      Procedures: No procedures performed   Clinical Data: No additional findings.   Subjective: Chief Complaint  Patient presents with   Left Knee - Pain    HPI Courtney Fernandez is a 65 year old female comes in today with left knee pain that started 10 days ago.  She reports that she tripped over a vacuum cleaner coming down on her left knee.  No other injury.  She has had pain in the anterior medial aspect of the knee since that time.  She states that knee pain overall is getting worse.  She denies giving way of the knee.  She does not use any assistive device to ambulate.  She is tried aspirin and ibuprofen for the knee pain with no real relief.  No other treatment.   She does state that the knee pain wakes her up at night.  Patient is diabetic hemoglobin A1c 8.2  Review of Systems See HPI  Objective: Vital Signs: There were no vitals taken for this visit.  Physical Exam Constitutional:      Appearance: She is not ill-appearing or diaphoretic.  Pulmonary:     Effort: Pulmonary effort is normal.  Neurological:     Mental Status: She is oriented to person, place, and time.  Psychiatric:        Behavior: Behavior normal.    Ortho Exam Bilateral knees good range of motion both knees.  Forced flexion of the left knee causes pain.  She has tenderness along the volar left knee medial joint line and over the patella tibial tendon region.  Able to do a straight leg raise on the left.  No abnormal warmth erythema.  No skin breakdown.  Slight effusion.  No instability valgus varus stressing of either knee.  McMurray's is negative bilaterally Specialty Comments:  No specialty comments available.  Imaging: XR Knee 1-2 Views Left  Result Date: 02/14/2021 Left knee  3 views: Knee is well located.  No acute fracture.  Mild narrowing medial joint line.  Mild effusion.  No bony abnormalities otherwise.    PMFS History: Patient Active Problem List   Diagnosis Date Noted   Hyperglycemia 07/31/2015   Uncontrolled type 2 diabetes mellitus with hyperosmolar nonketotic hyperglycemia ([facility]) 07/31/2015   Past Medical History:  Diagnosis Date   Diabetes mellitus without complication ([facility])    NEW ONSET  2016   Hypertension    Thyroid disease     History reviewed. No pertinent family history.  Past Surgical History:  Procedure Laterality Date   ANKLE SURGERY Right    APPENDECTOMY     WISDOM TOOTH EXTRACTION     Social History   Occupational History   Not on file  Tobacco Use   Smoking status: Former    Pack years: 0.00   Smokeless tobacco: Never   Tobacco comments:    quit smoking 30 years ago   Substance and Sexual Activity   Alcohol use: No   Drug  use: No   Sexual activity: Not on file

## 2021-02-27 ENCOUNTER — Ambulatory Visit (INDEPENDENT_AMBULATORY_CARE_PROVIDER_SITE_OTHER): Payer: 59 | Admitting: Physician Assistant

## 2021-02-27 ENCOUNTER — Other Ambulatory Visit: Payer: Self-pay

## 2021-02-27 ENCOUNTER — Encounter: Payer: Self-pay | Admitting: Physician Assistant

## 2021-02-27 DIAGNOSIS — M7052 Other bursitis of knee, left knee: Secondary | ICD-10-CM | POA: Diagnosis not present

## 2021-02-27 DIAGNOSIS — M7050 Other bursitis of knee, unspecified knee: Secondary | ICD-10-CM

## 2021-02-27 MED ORDER — LIDOCAINE HCL 1 % IJ SOLN
0.5000 mL | INTRAMUSCULAR | Status: AC | PRN
  Administered 2021-02-27: .5 mL

## 2021-02-27 MED ORDER — METHYLPREDNISOLONE ACETATE 40 MG/ML IJ SUSP
0.5000 mL | INTRAMUSCULAR | Status: AC | PRN
  Administered 2021-02-27: .5 mL via INTRAMUSCULAR

## 2021-02-27 NOTE — Progress Notes (Signed)
HPI: Courtney Fernandez returns today for follow-up of her left knee status post aspiration on 02/14/2021.  She states she 70 to 75% better.  Does have a catching sensation in the knee if she is like getting in and out of a car.  Otherwise no mechanical symptoms.  She been using Voltaren gel on the knee and feels that this is helping.  Most of her pain is at the medial aspect of the knee.  Review of systems see HPI otherwise negative Physical exam: Left knee no effusion abnormal warmth erythema.  No instability valgus varus stressing.  She is nontender over the medial lateral joint line.  She has tenderness over the pes anserinus.         Procedure Note  Patient: Courtney Fernandez             Date of Birth: Sep 19, 1955           MRN: [id number]             Visit Date: 02/27/2021  Procedures: Visit Diagnoses:  1. Pes anserine bursitis     Trigger Point Inj  Date/Time: 02/27/2021 5:33 PM Performed by: Kirtland Bouchard, PA-C Authorized by: Kirtland Bouchard, PA-C   Indications:  Pain Total # of Trigger Points:  1 Location: lower extremity   Approach:  Medial Medications #1:  0.5 mL lidocaine 1 %; 0.5 mL methylPREDNISolone acetate 40 MG/ML   Plan: She will continue to use Voltaren gel.  We will see her back in on an as-needed basis or if she continues to have pain over the next 4 weeks.  She will continue quad strengthening exercises.  Questions were encouraged and answered at length.

## 2021-08-07 IMAGING — CT CT HEAD W/O CM
4 series · 16 of 47 positions shown, 18 images · non-contrast
Comparison: Maxillofacial CT 08/24/2018

CLINICAL DATA: Acute severe headache, worse headache of life

EXAM:
CT HEAD WITHOUT CONTRAST
TECHNIQUE: Contiguous axial images were obtained from the base of the skull
through the vertex without intravenous contrast.

[Series 3: head wo · axial · 0.42mm/px · z∈[-116,+4]mm · 7 of 33 slices shown, 9 images]
[im 5/33  brain]
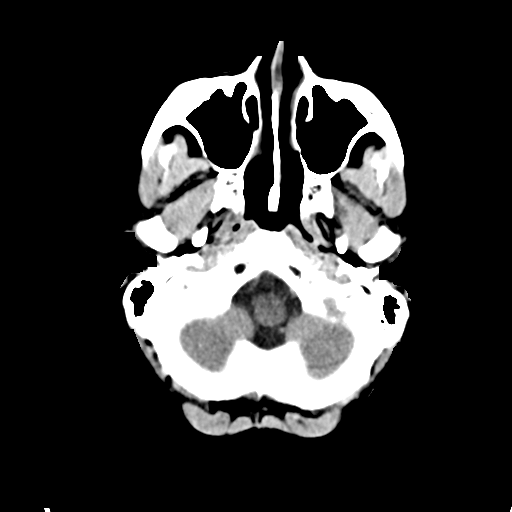
[im 5/33  bone]
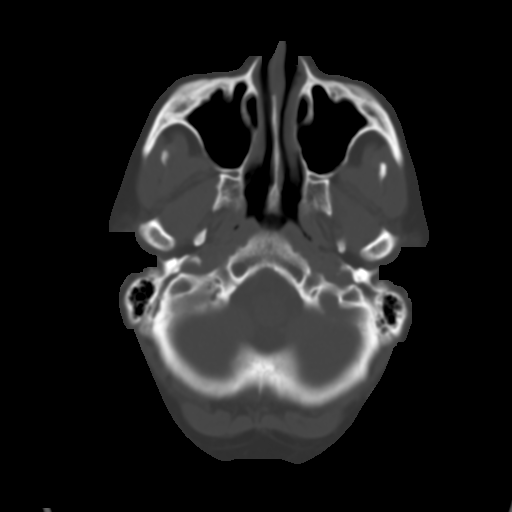
[im 9/33  brain]
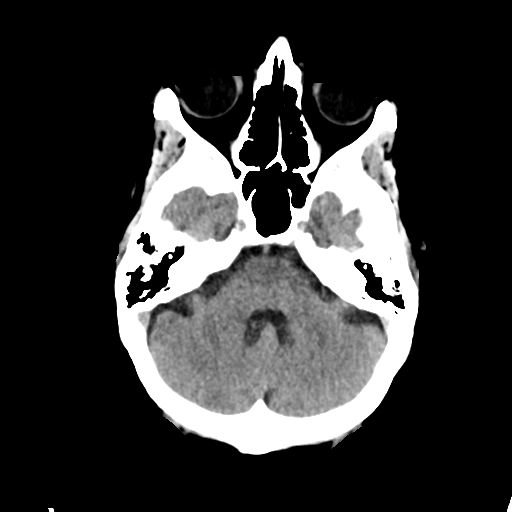
[im 13/33  brain]
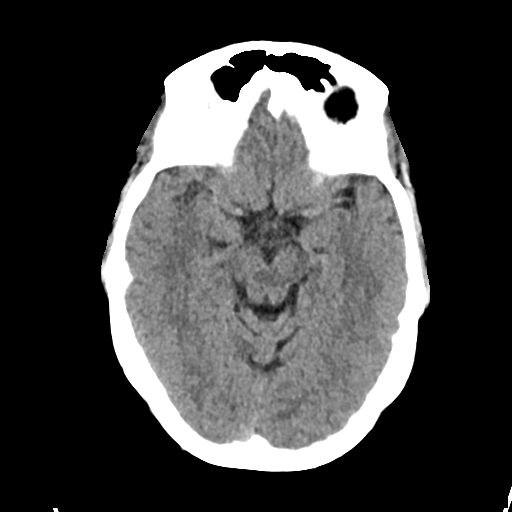
[im 17/33  brain]
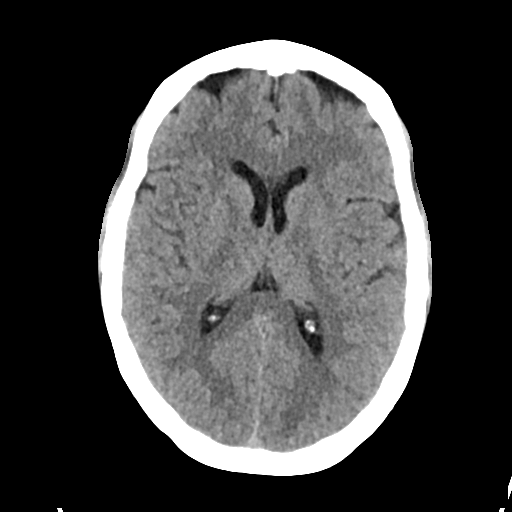
[im 21/33  brain]
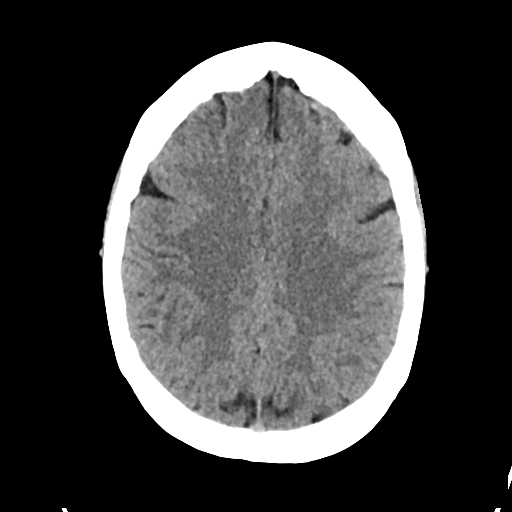
[im 21/33  bone]
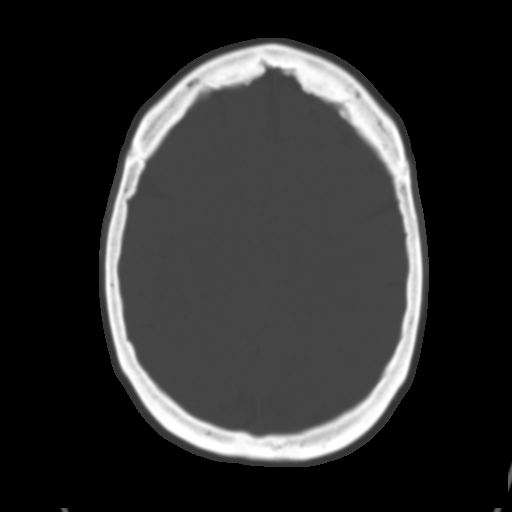
[im 25/33  brain]
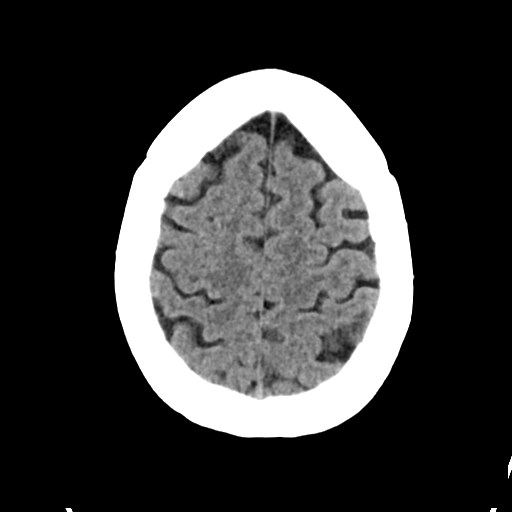
[im 29/33  brain]
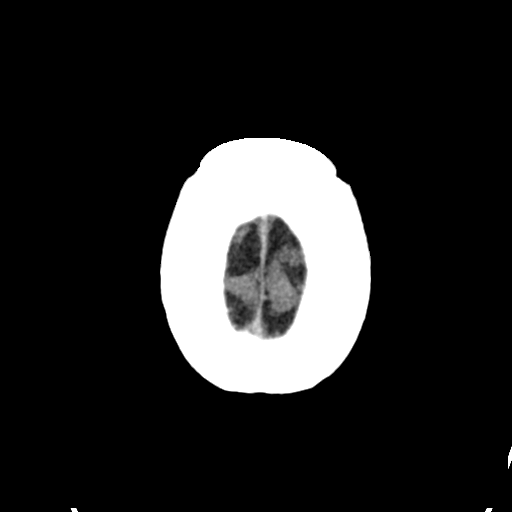

[Series 4: head bone · axial · 0.42mm/px · z∈[-120,-88]mm · 3 of 83 slices shown]
[im 9/83  bone]
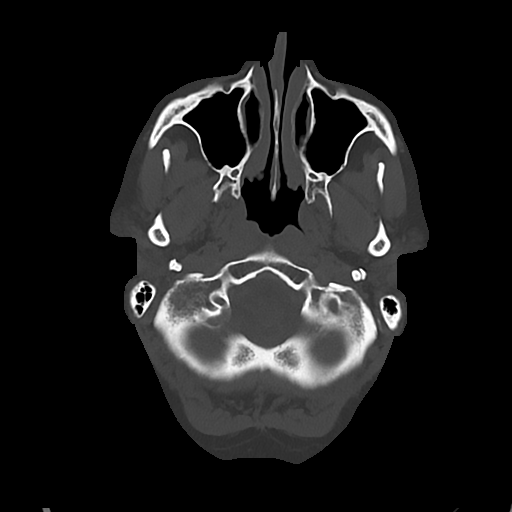
[im 17/83  bone]
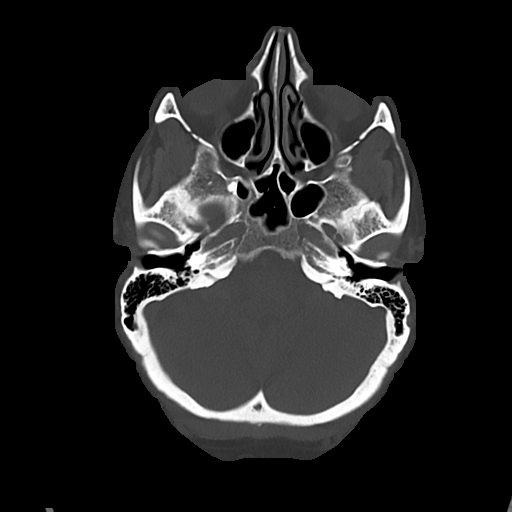
[im 25/83  bone]
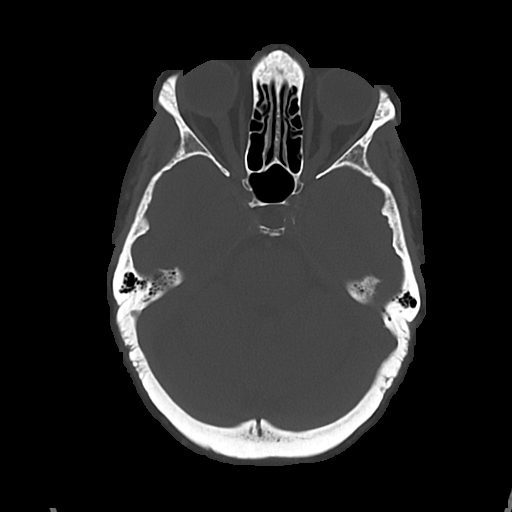

[Series 5: cor soft · coronal · 0.33mm/px · 3 of 68 slices shown]
[im 23/68  brain]
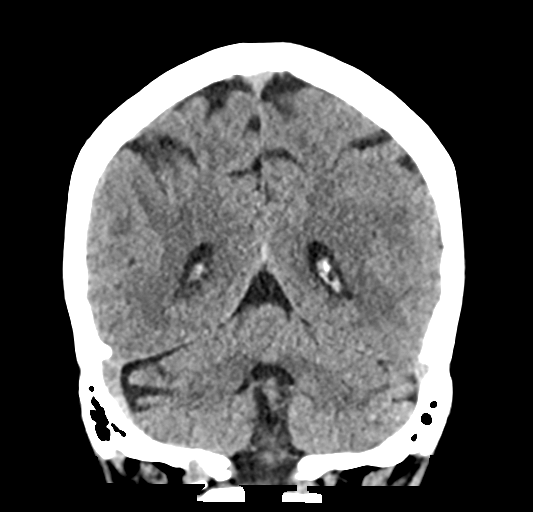
[im 30/68  brain]
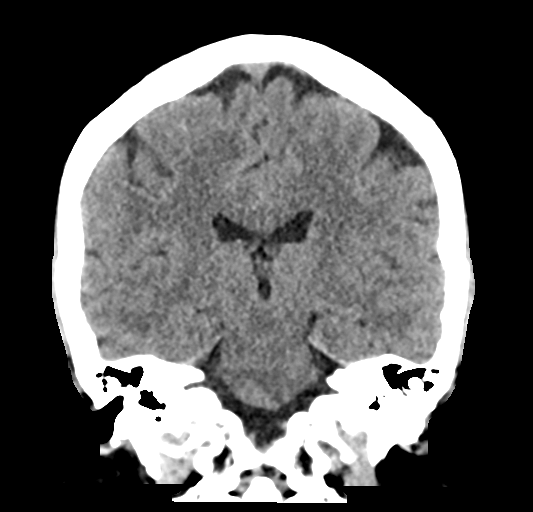
[im 38/68  brain]
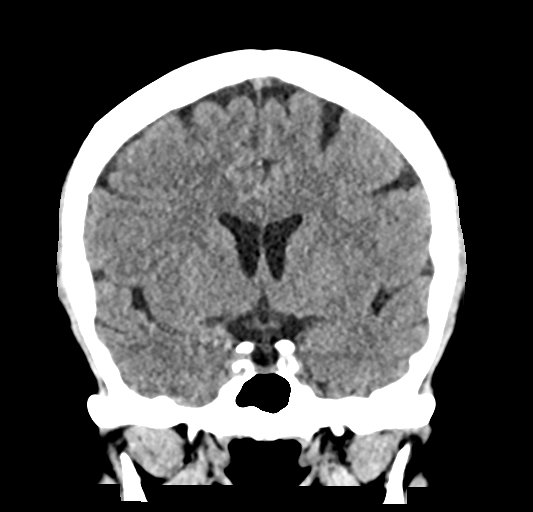

[Series 6: sag soft · sagittal · 0.33mm/px · 3 of 60 slices shown]
[im 20/60  brain]
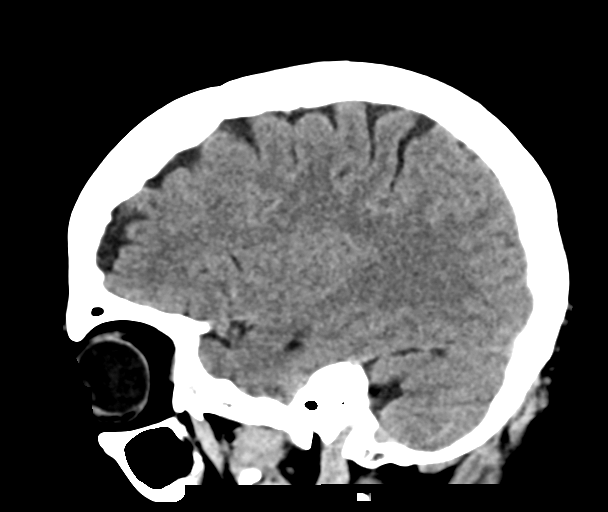
[im 30/60  brain]
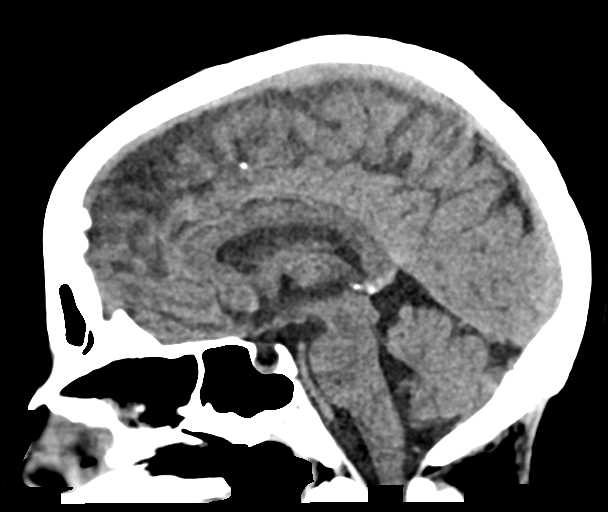
[im 40/60  brain]
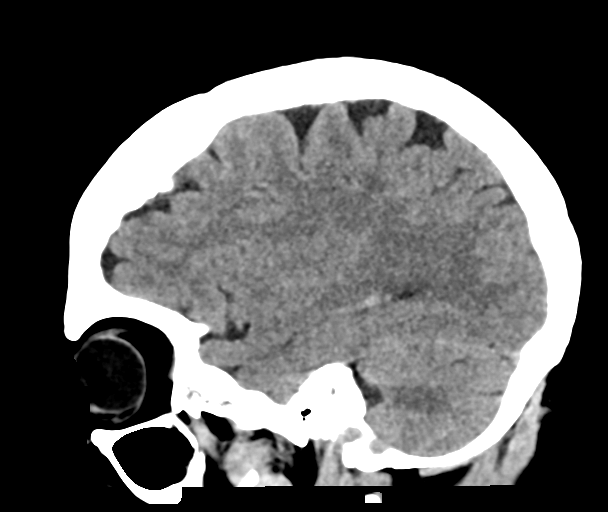

[16 of 47 positions shown; findings below may reference images not displayed]

FINDINGS: Brain: No evidence of acute infarction, hemorrhage, hydrocephalus,
extra-axial collection or mass lesion/mass effect. Symmetric
prominence of the ventricles, cisterns and sulci compatible with
parenchymal volume loss. Patchy areas of white matter
hypoattenuation are most compatible with chronic microvascular
angiopathy.

Vascular: Atherosclerotic calcification of the carotid siphons. No
hyperdense vessel.

Skull: No calvarial fracture or suspicious osseous lesion. No scalp
swelling or hematoma.

Sinuses/Orbits: Paranasal sinuses and mastoid air cells are
predominantly clear. Orbital structures are unremarkable aside from
prior lens extractions.

Other: Stable soft tissue nodule just inferior to the right zygoma
is unchanged from maxillofacial CT.
IMPRESSION: No acute intracranial abnormality. No hemorrhage or extra-axial
collection.

Mild parenchymal volume loss and chronic microvascular ischemic
changes.

Stable soft tissue nodule just inferior to the right zygoma,
previously characterized on maxillofacial CT. Possibly peripheral
nerve sheath tumor.

## 2022-04-03 ENCOUNTER — Encounter: Payer: Self-pay | Admitting: Podiatry

## 2022-04-03 ENCOUNTER — Ambulatory Visit: Payer: Commercial Managed Care - HMO | Admitting: Podiatry

## 2022-04-03 ENCOUNTER — Ambulatory Visit (INDEPENDENT_AMBULATORY_CARE_PROVIDER_SITE_OTHER): Payer: Commercial Managed Care - HMO

## 2022-04-03 DIAGNOSIS — M722 Plantar fascial fibromatosis: Secondary | ICD-10-CM

## 2022-04-03 MED ORDER — DICLOFENAC SODIUM 75 MG PO TBEC: 75.0000 mg | DELAYED_RELEASE_TABLET | Freq: Two times a day (BID) | ORAL | 2 refills | Status: AC

## 2022-04-03 MED ORDER — TRIAMCINOLONE ACETONIDE 10 MG/ML IJ SUSP
10.0000 mg | Freq: Once | INTRAMUSCULAR | Status: AC
  Administered 2022-04-03: 10 mg

## 2022-04-03 NOTE — Patient Instructions (Signed)

## 2022-04-04 NOTE — Progress Notes (Signed)
Subjective:   Patient ID: Courtney Fernandez, female   DOB: 66 y.o.   MRN: [id number]   HPI Patient presents with significant pain in the left plantar heel x2 months.  States its been sharp and and she does not remember injury and has not had this in the past.  Patient does not currently smoke likes to be active   Review of Systems  All other systems reviewed and are negative.       Objective:  Physical Exam Vitals and nursing note reviewed.  Constitutional:      Appearance: She is well-developed.  Pulmonary:     Effort: Pulmonary effort is normal.  Musculoskeletal:        General: Normal range of motion.  Skin:    General: Skin is warm.  Neurological:     Mental Status: She is alert.     Neurovascular status found to be intact muscle strength was adequate range of motion adequate with exquisite discomfort plantar aspect left heel with inflammation fluid around the medial band.  Patient is found to have good digital perfusion well oriented x3 Assessment:  Acute plantar fasciitis left with inflammation fluid of the medial band heel     Plan:  H&P reviewed condition and went ahead today and explained plantar fascial inflammation and reviewed x-rays.  I did sterile prep injected the fascia at insertion 3 mg Kenalog milligram Xylocaine wide fascial brace instructions on usage gave instructions for stretching exercises shoe gear modifications and reappoint to recheck 2 weeks  X-rays indicate spur formation no indications of stress fracture arthritis with moderate depression of the arch

## 2022-04-17 ENCOUNTER — Encounter: Payer: Self-pay | Admitting: Podiatry

## 2022-04-17 ENCOUNTER — Ambulatory Visit: Payer: Commercial Managed Care - HMO | Admitting: Podiatry

## 2022-04-17 DIAGNOSIS — M722 Plantar fascial fibromatosis: Secondary | ICD-10-CM

## 2022-04-17 NOTE — Progress Notes (Signed)
Subjective:   Patient ID: Courtney Fernandez, female   DOB: 66 y.o.   MRN: [id number]   HPI Patient states she is doing really well with the pain and states that she gets minimal symptoms but much better   ROS      Objective:  Physical Exam  Neurovascular status intact with discomfort plantar aspect left heel that is improved with swelling still noted around the medial and central band at insertion but overall much better     Assessment:  Fasciitis left improving with pain that receded by about 80%     Plan:  Due to great length condition discussed about treatment options continue oral anti-inflammatory continue shoe gear modifications heel lift and explained what to do if symptoms should persist or get worse with possible future treatment to be done.  Patient will be seen back encouraged to call with questions concerns

## 2023-07-03 DIAGNOSIS — E109 Type 1 diabetes mellitus without complications: Secondary | ICD-10-CM | POA: Diagnosis not present

## 2023-07-03 DIAGNOSIS — I10 Essential (primary) hypertension: Secondary | ICD-10-CM | POA: Diagnosis not present

## 2023-07-03 DIAGNOSIS — G720 Drug-induced myopathy: Secondary | ICD-10-CM | POA: Diagnosis not present

## 2023-07-03 DIAGNOSIS — Z4681 Encounter for fitting and adjustment of insulin pump: Secondary | ICD-10-CM | POA: Diagnosis not present

## 2023-07-03 DIAGNOSIS — E785 Hyperlipidemia, unspecified: Secondary | ICD-10-CM | POA: Diagnosis not present

## 2023-07-03 DIAGNOSIS — Z794 Long term (current) use of insulin: Secondary | ICD-10-CM | POA: Diagnosis not present

## 2023-07-23 DIAGNOSIS — I10 Essential (primary) hypertension: Secondary | ICD-10-CM | POA: Diagnosis not present

## 2023-07-23 DIAGNOSIS — E039 Hypothyroidism, unspecified: Secondary | ICD-10-CM | POA: Diagnosis not present

## 2023-07-23 DIAGNOSIS — F329 Major depressive disorder, single episode, unspecified: Secondary | ICD-10-CM | POA: Diagnosis not present

## 2023-07-23 DIAGNOSIS — G720 Drug-induced myopathy: Secondary | ICD-10-CM | POA: Diagnosis not present

## 2023-07-23 DIAGNOSIS — Z4681 Encounter for fitting and adjustment of insulin pump: Secondary | ICD-10-CM | POA: Diagnosis not present

## 2023-07-23 DIAGNOSIS — L930 Discoid lupus erythematosus: Secondary | ICD-10-CM | POA: Diagnosis not present

## 2023-07-23 DIAGNOSIS — E669 Obesity, unspecified: Secondary | ICD-10-CM | POA: Diagnosis not present

## 2023-07-23 DIAGNOSIS — R1013 Epigastric pain: Secondary | ICD-10-CM | POA: Diagnosis not present

## 2023-07-23 DIAGNOSIS — E785 Hyperlipidemia, unspecified: Secondary | ICD-10-CM | POA: Diagnosis not present

## 2023-07-23 DIAGNOSIS — E109 Type 1 diabetes mellitus without complications: Secondary | ICD-10-CM | POA: Diagnosis not present

## 2023-07-23 DIAGNOSIS — G4733 Obstructive sleep apnea (adult) (pediatric): Secondary | ICD-10-CM | POA: Diagnosis not present

## 2023-07-23 DIAGNOSIS — I679 Cerebrovascular disease, unspecified: Secondary | ICD-10-CM | POA: Diagnosis not present

## 2023-07-28 ENCOUNTER — Other Ambulatory Visit: Payer: Self-pay | Admitting: Endocrinology

## 2023-07-28 DIAGNOSIS — R1013 Epigastric pain: Secondary | ICD-10-CM

## 2023-08-06 ENCOUNTER — Other Ambulatory Visit: Payer: Self-pay

## 2023-08-17 ENCOUNTER — Ambulatory Visit
Admission: RE | Admit: 2023-08-17 | Discharge: 2023-08-17 | Disposition: A | Discharge status: Home / Self Care | Payer: PPO | Source: Ambulatory Visit | Attending: Endocrinology | Admitting: Endocrinology

## 2023-08-17 DIAGNOSIS — R1013 Epigastric pain: Secondary | ICD-10-CM | POA: Diagnosis not present

## 2023-09-16 DIAGNOSIS — Z4681 Encounter for fitting and adjustment of insulin pump: Secondary | ICD-10-CM | POA: Diagnosis not present

## 2023-09-16 DIAGNOSIS — Z794 Long term (current) use of insulin: Secondary | ICD-10-CM | POA: Diagnosis not present

## 2023-09-16 DIAGNOSIS — E109 Type 1 diabetes mellitus without complications: Secondary | ICD-10-CM | POA: Diagnosis not present

## 2023-09-16 DIAGNOSIS — G720 Drug-induced myopathy: Secondary | ICD-10-CM | POA: Diagnosis not present

## 2023-09-16 DIAGNOSIS — E785 Hyperlipidemia, unspecified: Secondary | ICD-10-CM | POA: Diagnosis not present

## 2023-09-16 DIAGNOSIS — I10 Essential (primary) hypertension: Secondary | ICD-10-CM | POA: Diagnosis not present

## 2023-11-05 DIAGNOSIS — H04123 Dry eye syndrome of bilateral lacrimal glands: Secondary | ICD-10-CM | POA: Diagnosis not present

## 2023-11-05 DIAGNOSIS — H1045 Other chronic allergic conjunctivitis: Secondary | ICD-10-CM | POA: Diagnosis not present

## 2023-11-05 DIAGNOSIS — E109 Type 1 diabetes mellitus without complications: Secondary | ICD-10-CM | POA: Diagnosis not present

## 2023-11-05 DIAGNOSIS — H40013 Open angle with borderline findings, low risk, bilateral: Secondary | ICD-10-CM | POA: Diagnosis not present

## 2023-11-05 DIAGNOSIS — H43813 Vitreous degeneration, bilateral: Secondary | ICD-10-CM | POA: Diagnosis not present

## 2023-11-10 DIAGNOSIS — N390 Urinary tract infection, site not specified: Secondary | ICD-10-CM | POA: Diagnosis not present

## 2023-11-18 DIAGNOSIS — B9689 Other specified bacterial agents as the cause of diseases classified elsewhere: Secondary | ICD-10-CM | POA: Diagnosis not present

## 2023-11-18 DIAGNOSIS — L089 Local infection of the skin and subcutaneous tissue, unspecified: Secondary | ICD-10-CM | POA: Diagnosis not present

## 2023-11-18 DIAGNOSIS — K1379 Other lesions of oral mucosa: Secondary | ICD-10-CM | POA: Diagnosis not present

## 2023-12-01 DIAGNOSIS — E109 Type 1 diabetes mellitus without complications: Secondary | ICD-10-CM | POA: Diagnosis not present

## 2023-12-01 DIAGNOSIS — I10 Essential (primary) hypertension: Secondary | ICD-10-CM | POA: Diagnosis not present

## 2023-12-01 DIAGNOSIS — E785 Hyperlipidemia, unspecified: Secondary | ICD-10-CM | POA: Diagnosis not present

## 2023-12-01 DIAGNOSIS — E559 Vitamin D deficiency, unspecified: Secondary | ICD-10-CM | POA: Diagnosis not present

## 2023-12-01 DIAGNOSIS — E039 Hypothyroidism, unspecified: Secondary | ICD-10-CM | POA: Diagnosis not present

## 2023-12-08 DIAGNOSIS — E039 Hypothyroidism, unspecified: Secondary | ICD-10-CM | POA: Diagnosis not present

## 2023-12-08 DIAGNOSIS — I679 Cerebrovascular disease, unspecified: Secondary | ICD-10-CM | POA: Diagnosis not present

## 2023-12-08 DIAGNOSIS — G4733 Obstructive sleep apnea (adult) (pediatric): Secondary | ICD-10-CM | POA: Diagnosis not present

## 2023-12-08 DIAGNOSIS — E669 Obesity, unspecified: Secondary | ICD-10-CM | POA: Diagnosis not present

## 2023-12-08 DIAGNOSIS — E785 Hyperlipidemia, unspecified: Secondary | ICD-10-CM | POA: Diagnosis not present

## 2023-12-08 DIAGNOSIS — Z4681 Encounter for fitting and adjustment of insulin pump: Secondary | ICD-10-CM | POA: Diagnosis not present

## 2023-12-08 DIAGNOSIS — F329 Major depressive disorder, single episode, unspecified: Secondary | ICD-10-CM | POA: Diagnosis not present

## 2023-12-08 DIAGNOSIS — E109 Type 1 diabetes mellitus without complications: Secondary | ICD-10-CM | POA: Diagnosis not present

## 2023-12-08 DIAGNOSIS — I10 Essential (primary) hypertension: Secondary | ICD-10-CM | POA: Diagnosis not present

## 2023-12-08 DIAGNOSIS — I6523 Occlusion and stenosis of bilateral carotid arteries: Secondary | ICD-10-CM | POA: Diagnosis not present

## 2023-12-08 DIAGNOSIS — R1013 Epigastric pain: Secondary | ICD-10-CM | POA: Diagnosis not present

## 2023-12-08 DIAGNOSIS — G720 Drug-induced myopathy: Secondary | ICD-10-CM | POA: Diagnosis not present

## 2024-02-10 DIAGNOSIS — E109 Type 1 diabetes mellitus without complications: Secondary | ICD-10-CM | POA: Diagnosis not present

## 2024-02-10 DIAGNOSIS — Z794 Long term (current) use of insulin: Secondary | ICD-10-CM | POA: Diagnosis not present

## 2024-02-10 DIAGNOSIS — E785 Hyperlipidemia, unspecified: Secondary | ICD-10-CM | POA: Diagnosis not present

## 2024-02-10 DIAGNOSIS — I10 Essential (primary) hypertension: Secondary | ICD-10-CM | POA: Diagnosis not present

## 2024-02-10 DIAGNOSIS — G720 Drug-induced myopathy: Secondary | ICD-10-CM | POA: Diagnosis not present

## 2024-04-11 DIAGNOSIS — Z794 Long term (current) use of insulin: Secondary | ICD-10-CM | POA: Diagnosis not present

## 2024-04-11 DIAGNOSIS — E785 Hyperlipidemia, unspecified: Secondary | ICD-10-CM | POA: Diagnosis not present

## 2024-04-11 DIAGNOSIS — G720 Drug-induced myopathy: Secondary | ICD-10-CM | POA: Diagnosis not present

## 2024-04-11 DIAGNOSIS — R1013 Epigastric pain: Secondary | ICD-10-CM | POA: Diagnosis not present

## 2024-04-11 DIAGNOSIS — E109 Type 1 diabetes mellitus without complications: Secondary | ICD-10-CM | POA: Diagnosis not present

## 2024-04-11 DIAGNOSIS — F329 Major depressive disorder, single episode, unspecified: Secondary | ICD-10-CM | POA: Diagnosis not present

## 2024-04-11 DIAGNOSIS — I10 Essential (primary) hypertension: Secondary | ICD-10-CM | POA: Diagnosis not present

## 2024-04-11 DIAGNOSIS — E669 Obesity, unspecified: Secondary | ICD-10-CM | POA: Diagnosis not present

## 2024-04-11 DIAGNOSIS — Z4681 Encounter for fitting and adjustment of insulin pump: Secondary | ICD-10-CM | POA: Diagnosis not present

## 2024-04-11 DIAGNOSIS — I6523 Occlusion and stenosis of bilateral carotid arteries: Secondary | ICD-10-CM | POA: Diagnosis not present

## 2024-04-11 DIAGNOSIS — G4733 Obstructive sleep apnea (adult) (pediatric): Secondary | ICD-10-CM | POA: Diagnosis not present

## 2024-04-11 DIAGNOSIS — E039 Hypothyroidism, unspecified: Secondary | ICD-10-CM | POA: Diagnosis not present

## 2024-04-18 ENCOUNTER — Telehealth: Payer: Self-pay

## 2024-04-18 NOTE — Telephone Encounter (Signed)
 Received new referral from Dr. Garnette Ore at [facility] for OSA eval. Placed in sleep mailbox

## 2024-05-16 ENCOUNTER — Institutional Professional Consult (permissible substitution): Admitting: Neurology

## 2024-08-17 NOTE — Progress Notes (Signed)
 Courtney Fernandez                                          MRN: [id number]   08/17/2024   The [facility] Quality Team Specialist reviewed this patient medical record for the purposes of chart review for care gap closure. The following were reviewed: chart review for care gap closure-kidney health evaluation for diabetes:eGFR  and uACR.    [facility] Quality Team
